# Patient Record
Sex: Male | Born: 1970 | Race: White | Hispanic: No | Marital: Married | Smoking: Current every day smoker
Health system: Southern US, Community
[De-identification: ages and names within clinical notes are randomized; demographics above are authoritative.]

## PROBLEM LIST (undated history)

## (undated) DIAGNOSIS — G4730 Sleep apnea, unspecified: Secondary | ICD-10-CM

## (undated) DIAGNOSIS — D469 Myelodysplastic syndrome, unspecified: Principal | ICD-10-CM

## (undated) DIAGNOSIS — E119 Type 2 diabetes mellitus without complications: Secondary | ICD-10-CM

## (undated) DIAGNOSIS — D473 Essential (hemorrhagic) thrombocythemia: Secondary | ICD-10-CM

## (undated) DIAGNOSIS — D471 Chronic myeloproliferative disease: Secondary | ICD-10-CM

## (undated) HISTORY — DX: Myelodysplastic syndrome, unspecified: D46.9

## (undated) HISTORY — DX: Chronic myeloproliferative disease: D47.1

## (undated) HISTORY — DX: Essential (hemorrhagic) thrombocythemia: D47.3

---

## 2004-09-29 HISTORY — PX: ROTATOR CUFF REPAIR: SHX139

## 2007-05-20 ENCOUNTER — Ambulatory Visit (HOSPITAL_BASED_OUTPATIENT_CLINIC_OR_DEPARTMENT_OTHER): Admission: RE | Admit: 2007-05-20 | Discharge: 2007-05-20 | Payer: Self-pay | Admitting: Orthopedic Surgery

## 2011-02-11 NOTE — Op Note (Signed)
NAME:  Patrick Blake, Patrick Blake              ACCOUNT NO.:  [id number]   MEDICAL RECORD NO.:  [id number]          PATIENT TYPE:  AMB   LOCATION:  DSC                          FACILITY:  [facility]   PHYSICIAN:  Loreta Ave, M.D. DATE OF BIRTH:  Dec 10, 1970   DATE OF PROCEDURE:  05/20/2007  DATE OF DISCHARGE:                               OPERATIVE REPORT   PREOPERATIVE DIAGNOSIS:  Left shoulder impingement, labral tear, distal  clavicle osteolysis.   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   PROCEDURE:  Left shoulder exam under anesthesia, arthroscopy, extensive  debridement glenohumeral joint including labrum and glenoid.  Removal of  loose fragments of labrum.  Bursectomy, acromioplasty, CA ligament  release.  Excision distal clavicle.   SURGEON:  Loreta Ave, M.D.   ASSISTANT:  Zonia Kief, PA   ANESTHESIA:  General.   BLOOD LOSS:  Minimal.   SPECIMENS:  None.   COMPLICATIONS:  None.   DRESSING:  Soft compressive with sling.   PROCEDURE:  The patient brought to the operating room, placed on  operating table in supine position.  After adequate anesthesia been  obtained, left shoulder examined.  Full motion good stability.  Placed  in beach-chair position on a shoulder positioner and prepped and draped  in the usual sterile fashion.  Three portals created, anterior,  posterior, lateral.  Shoulder entered from the poster portal with blunt  obturator.  Arthroscope introduced, shoulder distended and inspected.  Extensive complex circumferential tearing labrum.  Numerous displaced  fragments.  This was debrided all way around to stable margin removing  all loose fragments.  Some grade 2, mild grade 3 change found of the  glenoid debrided.  Biceps tendon, biceps anchor intact.  Undersurface of  the cuff looked good.  Cannula redirected subacromially.  A  lot of  reactive bursitis debrided.  Some erythema on the top of the cuff but  structurally intact.  Type 2 acromion.  After bursa resected, cuff  debrided.  Acromioplasty to a type 1 acromion releasing CA ligament with  cautery.  Distal clavicle grade 3 chondromalacia osteolysis.  Periarticular spurs lateral centimeter  resected.  Adequacy of decompression confirmed viewing from all portals.  Instruments and fluid removed.  Portals closed with nylon.  Sterile  compressive dressing applied.  Anesthesia reversed.  Brought to recovery  room.  Tolerated surgery well.  No complications.      Loreta Ave, M.D.  Electronically Signed     DFM/MEDQ  D:  05/20/2007  T:  05/21/2007  Job:  [id number]

## 2011-07-11 LAB — I-STAT 8, (EC8 V) (CONVERTED LAB)
BUN: 11
Chloride: 103
Potassium: 4.5
pCO2, Ven: 47.1
pH, Ven: 7.375 — ABNORMAL HIGH

## 2011-07-11 LAB — POCT HEMOGLOBIN-HEMACUE
Hemoglobin: 15.2
Operator id: 123881

## 2014-08-20 DIAGNOSIS — M25569 Pain in unspecified knee: Secondary | ICD-10-CM | POA: Insufficient documentation

## 2014-08-20 DIAGNOSIS — E10649 Type 1 diabetes mellitus with hypoglycemia without coma: Secondary | ICD-10-CM | POA: Insufficient documentation

## 2014-08-20 DIAGNOSIS — E119 Type 2 diabetes mellitus without complications: Secondary | ICD-10-CM | POA: Insufficient documentation

## 2014-12-25 ENCOUNTER — Other Ambulatory Visit: Payer: Self-pay | Admitting: Otolaryngology

## 2014-12-25 DIAGNOSIS — H905 Unspecified sensorineural hearing loss: Secondary | ICD-10-CM

## 2015-09-20 ENCOUNTER — Encounter: Payer: Self-pay | Admitting: Hematology & Oncology

## 2015-09-20 DIAGNOSIS — D75839 Thrombocytosis, unspecified: Secondary | ICD-10-CM

## 2015-09-20 DIAGNOSIS — D471 Chronic myeloproliferative disease: Secondary | ICD-10-CM

## 2015-09-20 DIAGNOSIS — D473 Essential (hemorrhagic) thrombocythemia: Secondary | ICD-10-CM | POA: Insufficient documentation

## 2015-09-20 HISTORY — DX: Thrombocytosis, unspecified: D75.839

## 2015-09-20 HISTORY — DX: Chronic myeloproliferative disease: D47.1

## 2015-09-21 ENCOUNTER — Ambulatory Visit (HOSPITAL_BASED_OUTPATIENT_CLINIC_OR_DEPARTMENT_OTHER): Payer: Commercial Managed Care - HMO | Admitting: Hematology & Oncology

## 2015-09-21 ENCOUNTER — Encounter: Payer: Self-pay | Admitting: Hematology & Oncology

## 2015-09-21 ENCOUNTER — Ambulatory Visit: Payer: Commercial Managed Care - HMO

## 2015-09-21 ENCOUNTER — Other Ambulatory Visit (HOSPITAL_BASED_OUTPATIENT_CLINIC_OR_DEPARTMENT_OTHER): Payer: Commercial Managed Care - HMO

## 2015-09-21 VITALS — BP 107/73 | HR 107 | Temp 97.4°F | Resp 16 | Ht 73.0 in | Wt 268.0 lb

## 2015-09-21 DIAGNOSIS — D471 Chronic myeloproliferative disease: Secondary | ICD-10-CM

## 2015-09-21 DIAGNOSIS — D75839 Thrombocytosis, unspecified: Secondary | ICD-10-CM

## 2015-09-21 DIAGNOSIS — D473 Essential (hemorrhagic) thrombocythemia: Secondary | ICD-10-CM | POA: Diagnosis not present

## 2015-09-21 LAB — CBC WITH DIFFERENTIAL (CANCER CENTER ONLY)
BASO#: 0 10*3/uL (ref 0.0–0.2)
BASO%: 0.3 % (ref 0.0–2.0)
EOS ABS: 0.1 10*3/uL (ref 0.0–0.5)
EOS%: 0.7 % (ref 0.0–7.0)
HCT: 39.9 % (ref 38.7–49.9)
HEMOGLOBIN: 12.8 g/dL — AB (ref 13.0–17.1)
LYMPH#: 1.7 10*3/uL (ref 0.9–3.3)
LYMPH%: 16.7 % (ref 14.0–48.0)
MCH: 26.4 pg — AB (ref 28.0–33.4)
MCHC: 32.1 g/dL (ref 32.0–35.9)
MCV: 82 fL (ref 82–98)
MONO#: 1.5 10*3/uL — ABNORMAL HIGH (ref 0.1–0.9)
MONO%: 14.9 % — AB (ref 0.0–13.0)
NEUT%: 67.4 % (ref 40.0–80.0)
NEUTROS ABS: 6.8 10*3/uL — AB (ref 1.5–6.5)
Platelets: 2053 10*3/uL — ABNORMAL HIGH (ref 145–400)
RBC: 4.85 10*6/uL (ref 4.20–5.70)
RDW: 22.7 % — ABNORMAL HIGH (ref 11.1–15.7)
WBC: 10 10*3/uL (ref 4.0–10.0)

## 2015-09-21 LAB — COMPREHENSIVE METABOLIC PANEL
ALBUMIN: 3.9 g/dL (ref 3.5–5.0)
ALK PHOS: 52 U/L (ref 40–150)
ALT: 13 U/L (ref 0–55)
AST: 14 U/L (ref 5–34)
Anion Gap: 10 mEq/L (ref 3–11)
BILIRUBIN TOTAL: 0.66 mg/dL (ref 0.20–1.20)
BUN: 13.8 mg/dL (ref 7.0–26.0)
CO2: 24 mEq/L (ref 22–29)
CREATININE: 0.9 mg/dL (ref 0.7–1.3)
Calcium: 9.2 mg/dL (ref 8.4–10.4)
Chloride: 102 mEq/L (ref 98–109)
GLUCOSE: 302 mg/dL — AB (ref 70–140)
Potassium: 4 mEq/L (ref 3.5–5.1)
SODIUM: 136 meq/L (ref 136–145)
TOTAL PROTEIN: 7 g/dL (ref 6.4–8.3)

## 2015-09-21 LAB — IRON AND TIBC
%SAT: 17 % — AB (ref 20–55)
Iron: 65 ug/dL (ref 42–163)
TIBC: 373 ug/dL (ref 202–409)
UIBC: 308 ug/dL (ref 117–376)

## 2015-09-21 LAB — FERRITIN: Ferritin: 13 ng/ml — ABNORMAL LOW (ref 22–316)

## 2015-09-21 LAB — LACTATE DEHYDROGENASE: LDH: 220 U/L (ref 125–245)

## 2015-09-21 LAB — TECHNOLOGIST REVIEW CHCC SATELLITE

## 2015-09-21 LAB — CHCC SATELLITE - SMEAR

## 2015-09-21 MED ORDER — ONDANSETRON HCL 8 MG PO TABS: 8.0000 mg | ORAL_TABLET | Freq: Three times a day (TID) | ORAL | Status: DC | PRN

## 2015-09-21 MED ORDER — HYDROXYUREA 500 MG PO CAPS: ORAL_CAPSULE | ORAL | Status: DC

## 2015-09-21 NOTE — Progress Notes (Signed)
Referral MD  Reason for Referral:  Marked thrombocytosis  Chief Complaint  Patient presents with  . OTHER    New Patient  :  My blood is not right.  HPI:  Patrick Blake  Is a very nice 44 year old white male. He has insulin-dependent diabetes. He's had this for over 20 years. He is on insulin for this but not on insulin pump.  He has some routine blood work done a couple days ago. This, shockingly, showed that he had marked thrombocytosis. His platelet count was over 2.4 million. He was slightly leukocutotic with a white cell count of 14 has hemoglobin was 12.8.   He did have some bleeding issues. He uses this implantable device that helps monitor his blood sugars. When  He would implant this, he was started to bleed. Whenever he cut himself, he would bleed profusely. He is taking baby aspirin. I told him to stop this.   There is no history of blood problems in the family. He does not smoke. He has occasional alcoholl use. 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   he has 2 kids. They're healthy. His wife came with him today.   He's had no travel. He's had no change in medications.   He's had no change in bowel or bladder habits. He's had no nausea or vomiting. Does not have any weight loss or weight gain. He's had no abdominal pain or early satiety.   His been no leg swelling. He's had no rashes. He's had no pain in the hands or feet. He's had no headache.   He developed a right subconjunctival hemorrhage a few days ago. This is not affecting his  Eyesight.   Overall, his performance status is ECOG 0.     Past Medical History  Diagnosis Date  . 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 Thrombocytosis ([place]) 09/20/2015  :  No past surgical history on file.:   Current outpatient prescriptions:  .  aspirin 81 MG chewable tablet, Chew 81 mg by mouth., Disp: , Rfl:  .  ezetimibe (ZETIA) 10 MG tablet, Take 10 mg by mouth., Disp: , Rfl:   .  indapamide (LOZOL) 1.25 MG tablet, Take 1.25 mg by mouth., Disp: , Rfl:  .  insulin aspart (NOVOLOG) 100 UNIT/ML injection, Inject into the skin., Disp: , Rfl:  .  ONE TOUCH ULTRA TEST test strip, USE AS DIRECTED. TESTING FREQUENCY: 2-4 TIMES DAILY., Disp: , Rfl: 11 .  pioglitazone (ACTOS) 30 MG tablet, Take 30 mg by mouth., Disp: , Rfl:  .  ramipril (ALTACE) 10 MG capsule, Take 10 mg by mouth., Disp: , Rfl:  .  TOUJEO SOLOSTAR 300 UNIT/ML SOPN, TAKE 40 UNITS BEFORE BREAKFAST DAILY, Disp: , Rfl: 12 .  hydroxyurea (HYDREA) 500 MG capsule, Take 2 pills TWICE a day. May take with food to minimize GI side effects., Disp: 120 capsule, Rfl: 3 .  ondansetron (ZOFRAN) 8 MG tablet, Take 1 tablet (8 mg total) by mouth every 8 (eight) hours as needed for nausea or vomiting., Disp: 30 tablet, Rfl: 2:  :  Not on File:  No family history on file.:  Social History   Social History  . Marital Status: Married    Spouse Name: N/A  . Number of Children: N/A  . Years of Education: N/A   Occupational History  . Not on file.   Social History Main Topics  . Smoking status: Never Smoker   . Smokeless  tobacco: Not on file  . Alcohol Use: Not on file  . Drug Use: Not on file  . Sexual Activity: Not on file   Other Topics Concern  . Not on file   Social History Narrative  :  Pertinent items are noted in HPI.  Exam: '@IPVITALS'$ @  obese white male in no obvious distress. Vital signs show temperature of 97.4. Pulse 107. Blood pressure 107/73. Weight is 268  Pounds. Head and neck exam shows the right subconjunctival hemorrhage. He has good pupillary movement. He has good extraocular movement. There is no oral lesions. He has no adenopathy in the neck. Thyroid is not palpable. Lungs are clear bilaterally. Cardiac exam regular rate and rhythm with no murmurs, rubs or bruits. Abdomen is soft. He has good bowel sounds. He is obese. He has no palpable fluid wave. There is no palpable abdominal mass. He has  no palpable liver or spleen tip. Back exam shows no tenderness over the spine, ribs or hips. Extremities shows no clubbing, cyanosis or edema. He does have some varicose veins in his legs. He has good range motion of his joints. Skin exam shows no erythema in his hands or feet. He has no ecchymoses or petechia.   Recent Labs  09/21/15 0841  WBC 10.0  HGB 12.8*  HCT 39.9  PLT 2,053*   No results for input(s): NA, K, CL, CO2, GLUCOSE, BUN, CREATININE, CALCIUM in the last 72 hours.  Blood smear review:   Mild anisocytosis and poikilocytosis. He has a rare nucleated red blood cell. He has no target cells. There are no schistocytes or spherocytes. He has no rouleau formation. White cells are slightly increased in number. He has some immature myeloid cells. He may have 1 blast. Platelets are markedly increased in number. He has numerous large platelets. He has an occasional mega-platelet.  Pathology:  none    Assessment and Plan:   Patrick Blake is a 44 year old white male. He has marked thrombocytosis. I have to believe that he will have some subtype of myeloproliferative neoplasm. I would have to think that essential thrombocythemia would be most likely by the blood smear. However, he does have some immature myeloid cells. We will check for chronic myeloid leukemia. I do not think that this is polycythemia. It is possible but with a low possibility that he may have myelofibrosis. I cannot feel his spleen but he is a large man.   We sent off the typical genetic assays for myeloproliferative neoplasms. I'm sure one of these will be positive. I would think that the JAK2 will be positive.   He does need to have a bone marrow biopsy. I think this is needed to try to help further qualify what is going on.   He will be started on Hydrea. We have to get his platelet count down. With the bleeding, he may have acquired von Willebrand disease. Again,I told him to stop the aspirin.   I spent about 1 hour  with he and his wife. I wrote down what I thought he had. I gave him a prescription for the Hydrea. He will take 1000 mg twice a day. I also gave him some Zofran in case he has any nausea.   I would like to see him back in about 3 weeks. By then, we will have had back all of his results and hopefully the bone marrow biopsy result.

## 2015-09-29 LAB — VON WILLEBRAND PANEL
Coagulation Factor VIII: 46 % — ABNORMAL LOW (ref 50–180)
RISTOCETIN CO-FACTOR, PLASMA: 38 % — AB (ref 42–200)
Von Willebrand Antigen, Plasma: 67 % (ref 50–217)

## 2015-09-29 LAB — CALRETICULIN (CALR) MUTATION ANALYSIS: CALR MUTATION (EXON 9): NOT DETECTED

## 2015-09-29 LAB — VITAMIN B12: VITAMIN B 12: 531 pg/mL (ref 211–911)

## 2015-09-29 LAB — APTT: APTT: 39 s — AB (ref 24–37)

## 2015-09-29 LAB — ERYTHROPOIETIN: Erythropoietin: 26.3 m[IU]/mL — ABNORMAL HIGH (ref 2.6–18.5)

## 2015-10-02 ENCOUNTER — Ambulatory Visit (HOSPITAL_COMMUNITY)
Admission: RE | Admit: 2015-10-02 | Discharge: 2015-10-02 | Disposition: A | Discharge status: Home / Self Care | Payer: Commercial Managed Care - HMO | Source: Ambulatory Visit | Attending: Hematology & Oncology | Admitting: Hematology & Oncology

## 2015-10-02 ENCOUNTER — Other Ambulatory Visit: Payer: Self-pay | Admitting: Radiology

## 2015-10-02 DIAGNOSIS — Z87891 Personal history of nicotine dependence: Secondary | ICD-10-CM | POA: Diagnosis not present

## 2015-10-02 DIAGNOSIS — D471 Chronic myeloproliferative disease: Secondary | ICD-10-CM

## 2015-10-02 DIAGNOSIS — D473 Essential (hemorrhagic) thrombocythemia: Secondary | ICD-10-CM

## 2015-10-02 DIAGNOSIS — D75839 Thrombocytosis, unspecified: Secondary | ICD-10-CM

## 2015-10-02 DIAGNOSIS — E109 Type 1 diabetes mellitus without complications: Secondary | ICD-10-CM | POA: Diagnosis not present

## 2015-10-02 DIAGNOSIS — R161 Splenomegaly, not elsewhere classified: Secondary | ICD-10-CM | POA: Insufficient documentation

## 2015-10-03 ENCOUNTER — Ambulatory Visit (HOSPITAL_COMMUNITY)
Admission: RE | Admit: 2015-10-03 | Discharge: 2015-10-03 | Disposition: A | Discharge status: Home / Self Care | Payer: Commercial Managed Care - HMO | Source: Ambulatory Visit | Attending: Hematology & Oncology | Admitting: Hematology & Oncology

## 2015-10-03 ENCOUNTER — Encounter (HOSPITAL_COMMUNITY): Payer: Self-pay

## 2015-10-03 DIAGNOSIS — D75839 Thrombocytosis, unspecified: Secondary | ICD-10-CM

## 2015-10-03 DIAGNOSIS — D473 Essential (hemorrhagic) thrombocythemia: Secondary | ICD-10-CM

## 2015-10-03 DIAGNOSIS — D471 Chronic myeloproliferative disease: Secondary | ICD-10-CM

## 2015-10-03 HISTORY — DX: Type 2 diabetes mellitus without complications: E11.9

## 2015-10-03 HISTORY — DX: Sleep apnea, unspecified: G47.30

## 2015-10-03 LAB — CBC WITH DIFFERENTIAL/PLATELET
BASOS PCT: 0 %
Band Neutrophils: 0 %
Basophils Absolute: 0 10*3/uL (ref 0.0–0.1)
Blasts: 0 %
EOS PCT: 3 %
Eosinophils Absolute: 0.3 10*3/uL (ref 0.0–0.7)
HEMATOCRIT: 35.8 % — AB (ref 39.0–52.0)
HEMOGLOBIN: 11.4 g/dL — AB (ref 13.0–17.0)
LYMPHS ABS: 1.8 10*3/uL (ref 0.7–4.0)
LYMPHS PCT: 18 %
MCH: 27.1 pg (ref 26.0–34.0)
MCHC: 31.8 g/dL (ref 30.0–36.0)
MCV: 85.2 fL (ref 78.0–100.0)
MONO ABS: 2 10*3/uL — AB (ref 0.1–1.0)
MONOS PCT: 20 %
Metamyelocytes Relative: 0 %
Myelocytes: 0 %
NEUTROS ABS: 5.3 10*3/uL (ref 1.7–7.7)
NEUTROS PCT: 54 %
NRBC: 0 /100{WBCs}
OTHER: 5 %
Platelets: 2039 10*3/uL (ref 150–400)
Promyelocytes Absolute: 0 %
RBC: 4.2 MIL/uL — AB (ref 4.22–5.81)
RDW: 23.2 % — AB (ref 11.5–15.5)
WBC: 9.9 10*3/uL (ref 4.0–10.5)

## 2015-10-03 LAB — GLUCOSE, CAPILLARY
GLUCOSE-CAPILLARY: 227 mg/dL — AB (ref 65–99)
Glucose-Capillary: 284 mg/dL — ABNORMAL HIGH (ref 65–99)
Glucose-Capillary: 244 mg/dL — ABNORMAL HIGH (ref 65–99)
Glucose-Capillary: 311 mg/dL — ABNORMAL HIGH (ref 65–99)

## 2015-10-03 LAB — PROTIME-INR
INR: 1.1 (ref 0.00–1.49)
Prothrombin Time: 14.4 seconds (ref 11.6–15.2)

## 2015-10-03 LAB — BONE MARROW EXAM

## 2015-10-03 MED ORDER — INSULIN GLARGINE 100 UNIT/ML ~~LOC~~ SOLN
25.0000 [IU] | SUBCUTANEOUS | Status: DC
  Filled 2015-10-03: qty 0.25

## 2015-10-03 MED ORDER — INSULIN GLARGINE 300 UNIT/ML ~~LOC~~ SOPN
30.0000 [IU] | PEN_INJECTOR | Freq: Once | SUBCUTANEOUS | Status: AC
  Administered 2015-10-03: 30 [IU] via SUBCUTANEOUS

## 2015-10-03 MED ORDER — SODIUM CHLORIDE 0.9 % IV SOLN
INTRAVENOUS | Status: DC
  Administered 2015-10-03: 07:00:00 via INTRAVENOUS

## 2015-10-03 MED ORDER — MIDAZOLAM HCL 2 MG/2ML IJ SOLN
INTRAMUSCULAR | Status: AC | PRN
  Administered 2015-10-03: 0.5 mg via INTRAVENOUS
  Administered 2015-10-03 (×2): 1 mg via INTRAVENOUS

## 2015-10-03 MED ORDER — FENTANYL CITRATE (PF) 100 MCG/2ML IJ SOLN
INTRAMUSCULAR | Status: AC
  Filled 2015-10-03: qty 4

## 2015-10-03 MED ORDER — MIDAZOLAM HCL 2 MG/2ML IJ SOLN
INTRAMUSCULAR | Status: AC
  Filled 2015-10-03: qty 6

## 2015-10-03 MED ORDER — FENTANYL CITRATE (PF) 100 MCG/2ML IJ SOLN
INTRAMUSCULAR | Status: AC | PRN
  Administered 2015-10-03 (×2): 50 ug via INTRAVENOUS

## 2015-10-03 NOTE — Progress Notes (Signed)
CBG post procedure 311. Pt self dosing short acting novolog insulin 12 u SQ. Rowe Robert aware.

## 2015-10-03 NOTE — Progress Notes (Signed)
CRITICAL VALUE ALERT  Critical value received: platelets 2039 Date of notification:  0800  Time of notification:  0800  Critical value read back:Yes.    Nurse who received alert:  Jonna Clark RN   MD notified (1st page):  Rowe Robert PA  Time of first page:  0810  MD notified (2nd page):  Time of second page:  Responding MD:  Rowe Robert PA   Time MD responded:  [phone]

## 2015-10-03 NOTE — Discharge Instructions (Signed)
Bone Marrow Aspiration and Bone Marrow Biopsy, Care After Do not return to work tomorrow 10/04/2015. Refer to this sheet in the next few weeks. These instructions provide you with information about caring for yourself after your procedure. Your health care provider may also give you more specific instructions. Your treatment has been planned according to current medical practices, but problems sometimes occur. Call your health care provider if you have any problems or questions after your procedure. WHAT TO EXPECT AFTER THE PROCEDURE After your procedure, it is common to have:  Soreness or tenderness around the puncture site.  Bruising. HOME CARE INSTRUCTIONS  Take medicines only as directed by your health care provider.  Follow your health care provider's instructions about:  Puncture site care.  Bandage (dressing) changes and removal.  Bathe and shower as directed by your health care provider.  Check your puncture site every day for signs of infection. Watch for:  Redness, swelling, or pain.  Fluid, blood, or pus.  Return to your normal activities as directed by your health care provider.  Keep all follow-up visits as directed by your health care provider. This is important. SEEK MEDICAL CARE IF:  You have a fever.  You have uncontrollable bleeding.  You have redness, swelling, or pain at the site of your puncture.  You have fluid, blood, or pus coming from your puncture site.   This information is not intended to replace advice given to you by your health care provider. Make sure you discuss any questions you have with your health care provider.   Document Released: 04/04/2005 Document Revised: 01/30/2015 Document Reviewed: 09/06/2014 Elsevier Interactive Patient Education 2016 [place]. Bone Marrow Aspiration and Bone Marrow Biopsy Bone marrow aspiration and bone marrow biopsy are procedures that are done to diagnose blood disorders. You may also have one of these  procedures to help diagnose infections or some types of cancer. Bone marrow is the soft tissue that is inside your bones. Blood cells are produced in bone marrow. For bone marrow aspiration, a sample of tissue in liquid form is removed from inside your bone. For a bone marrow biopsy, a small core of bone marrow tissue is removed. Then these samples are examined under a microscope or tested in a lab. You may need these procedures if you have an abnormal complete blood count (CBC). The aspiration or biopsy sample is usually taken from the top of your hip bone. Sometimes, an aspiration sample is taken from your chest bone (sternum). LET [facility] CARE PROVIDER KNOW ABOUT:  Any allergies you have.  All medicines you are taking, including vitamins, herbs, eye drops, creams, and over-the-counter medicines.  Previous problems you or members of your family have had with the use of anesthetics.  Any blood disorders you have.  Previous surgeries you have had.  Any medical conditions you may have.  Whether you are pregnant or you think that you may be pregnant. RISKS AND COMPLICATIONS Generally, this is a safe procedure. However, problems may occur, including:  Infection.  Bleeding. BEFORE THE PROCEDURE  Ask your health care provider about:  Changing or stopping your regular medicines. This is especially important if you are taking diabetes medicines or blood thinners.  Taking medicines such as aspirin and ibuprofen. These medicines can thin your blood. Do not take these medicines before your procedure if your health care provider instructs you not to.  Plan to have someone take you home after the procedure.  If you go home right after the  procedure, plan to have someone with you for 24 hours. PROCEDURE   An IV tube may be inserted into one of your veins.  The injection site will be cleaned with a germ-killing solution (antiseptic).  You will be given one or more of the  following:  A medicine that helps you relax (sedative).  A medicine that numbs the area (local anesthetic).  The bone marrow sample will be removed as follows:  For an aspiration, a hollow needle will be inserted through your skin and into your bone. Bone marrow fluid will be drawn up into a syringe.  For a biopsy, your health care provider will use a hollow needle to remove a core of tissue from your bone marrow.  The needle will be removed.  A bandage (dressing) will be placed over the insertion site and taped in place. The procedure may vary among health care providers and hospitals. AFTER THE PROCEDURE  Your blood pressure, heart rate, breathing rate, and blood oxygen level will be monitored often until the medicines you were given have worn off.  Return to your normal activities as directed by your health care provider.   This information is not intended to replace advice given to you by your health care provider. Make sure you discuss any questions you have with your health care provider.   Document Released: 09/18/2004 Document Revised: 01/30/2015 Document Reviewed: 09/06/2014 Elsevier Interactive Patient Education 2016 [employer] Moderate Conscious Sedation, Adult Sedation is the use of medicines to promote relaxation and relieve discomfort and anxiety. Moderate conscious sedation is a type of sedation. Under moderate conscious sedation you are less alert than normal but are still able to respond to instructions or stimulation. Moderate conscious sedation is used during short medical and dental procedures. It is milder than deep sedation or general anesthesia and allows you to return to your regular activities sooner. LET [facility] CARE PROVIDER KNOW ABOUT:   Any allergies you have.  All medicines you are taking, including vitamins, herbs, eye drops, creams, and over-the-counter medicines.  Use of steroids (by mouth or creams).  Previous problems you or members of  your family have had with the use of anesthetics.  Any blood disorders you have.  Previous surgeries you have had.  Medical conditions you have.  Possibility of pregnancy, if this applies.  Use of cigarettes, alcohol, or illegal drugs. RISKS AND COMPLICATIONS Generally, this is a safe procedure. However, as with any procedure, problems can occur. Possible problems include:  Oversedation. Moderate Conscious Sedation, Adult, Care After Refer to this sheet in the next few weeks. These instructions provide you with information on caring for yourself after your procedure. Your health care provider may also give you more specific instructions. Your treatment has been planned according to current medical practices, but problems sometimes occur. Call your health care provider if you have any problems or questions after your procedure. WHAT TO EXPECT AFTER THE PROCEDURE  After your procedure:  You may feel sleepy, clumsy, and have poor balance for several hours.  Vomiting may occur if you eat too soon after the procedure. HOME CARE INSTRUCTIONS  Do not participate in any activities where you could become injured for at least 24 hours. Do not:  Drive.  Swim.  Ride a bicycle.  Operate heavy machinery.  Cook.  Use power tools.  Climb ladders.  Work from a high place.  Do not make important decisions or sign legal documents until you are improved.  If you vomit, drink  water, juice, or soup when you can drink without vomiting. Make sure you have little or no nausea before eating solid foods.  Only take over-the-counter or prescription medicines for pain, discomfort, or fever as directed by your health care provider.  Make sure you and your family fully understand everything about the medicines given to you, including what side effects may occur.  You should not drink alcohol, take sleeping pills, or take medicines that cause drowsiness for at least 24 hours.  If you smoke, do  not smoke without supervision.  If you are feeling better, you may resume normal activities 24 hours after you were sedated.  Keep all appointments with your health care provider. SEEK MEDICAL CARE IF:  Your skin is pale or bluish in color.  You continue to feel nauseous or vomit.  Your pain is getting worse and is not helped by medicine.  You have bleeding or swelling.  You are still sleepy or feeling clumsy after 24 hours. SEEK IMMEDIATE MEDICAL CARE IF:  You develop a rash.  You have difficulty breathing.  You develop any type of allergic problem.  You have a fever. MAKE SURE YOU:  Understand these instructions.  Will watch your condition.  Will get help right away if you are not doing well or get worse.   This information is not intended to replace advice given to you by your health care provider. Make sure you discuss any questions you have with your health care provider.   Document Released: 07/06/2013 Document Revised: 10/06/2014 Document Reviewed: 07/06/2013 Elsevier Interactive Patient Education 2016 [place] breathing on your own. You may need to have a breathing tube until you are awake and breathing on your own.  Allergic reaction to any of the medicines used for the procedure. BEFORE THE PROCEDURE  You may have blood tests done. These tests can help show how well your kidneys and liver are working. They can also show how well your blood clots.  A physical exam will be done.  Only take medicines as directed by your health care provider. You may need to stop taking medicines (such as blood thinners, aspirin, or nonsteroidal anti-inflammatory drugs) before the procedure.   Do not eat or drink at least 6 hours before the procedure or as directed by your health care provider.  Arrange for a responsible adult, family member, or friend to take you home after the procedure. He or she should stay with you for at least 24 hours after the  procedure, until the medicine has worn off. PROCEDURE   An intravenous (IV) catheter will be inserted into one of your veins. Medicine will be able to flow directly into your body through this catheter. You may be given medicine through this tube to help prevent pain and help you relax.  The medical or dental procedure will be done. AFTER THE PROCEDURE  You will stay in a recovery area until the medicine has worn off. Your blood pressure and pulse will be checked.   Depending on the procedure you had, you may be allowed to go home when you can tolerate liquids and your pain is under control.   This information is not intended to replace advice given to you by your health care provider. Make sure you discuss any questions you have with your health care provider.   Document Released: 06/10/2001 Document Revised: 10/06/2014 Document Reviewed: 05/23/2013 Elsevier Interactive Patient Education [employer].

## 2015-10-03 NOTE — H&P (Signed)
Chief Complaint: Patient was seen in consultation today for CT guided bone marrow biopsy  Referring Physician(s): Ennever,Peter R  History of Present Illness: Patrick Blake is a 45 y.o. male with history of marked thrombocytosis who presents today for CT guided bone marrow biopsy for further evaluation.   Past Medical History  Diagnosis Date  . MPN (myeloproliferative neoplasm) ([place]) 09/20/2015  . Thrombocytosis ([place]) 09/20/2015  . Diabetes mellitus without complication ([place])   . Sleep apnea     loud snore    Past Surgical History  Procedure Laterality Date  . Rotator cuff repair Left 2006    Allergies: Review of patient's allergies indicates not on file.  Medications: Prior to Admission medications   Medication Sig Start Date End Date Taking? Authorizing Provider  ezetimibe (ZETIA) 10 MG tablet Take 10 mg by mouth.   Yes Historical Provider, MD  indapamide (LOZOL) 1.25 MG tablet Take 1.25 mg by mouth.   Yes Historical Provider, MD  aspirin 81 MG chewable tablet Chew 81 mg by mouth.    Historical Provider, MD  hydroxyurea (HYDREA) 500 MG capsule Take 2 pills TWICE a day. May take with food to minimize GI side effects. 09/21/15   [name], MD  insulin aspart (NOVOLOG) 100 UNIT/ML injection Inject into the skin.    Historical Provider, MD  ondansetron (ZOFRAN) 8 MG tablet Take 1 tablet (8 mg total) by mouth every 8 (eight) hours as needed for nausea or vomiting. 09/21/15   [name], MD  ONE TOUCH ULTRA TEST test strip USE AS DIRECTED. TESTING FREQUENCY: 2-4 TIMES DAILY. 08/24/15   Historical Provider, MD  pioglitazone (ACTOS) 30 MG tablet Take 30 mg by mouth.    Historical Provider, MD  ramipril (ALTACE) 10 MG capsule Take 10 mg by mouth.    Historical Provider, MD  TOUJEO SOLOSTAR 300 UNIT/ML SOPN TAKE 40 UNITS BEFORE BREAKFAST DAILY 08/26/15   Historical Provider, MD     Family History  Problem Relation Age of Onset  . Coronary artery disease Brother      Social History   Social History  . Marital Status: Married    Spouse Name: N/A  . Number of Children: N/A  . Years of Education: N/A   Social History Main Topics  . Smoking status: Former Smoker    Quit date: 09/29/2008  . Smokeless tobacco: None  . Alcohol Use: 0.0 oz/week    0 Standard drinks or equivalent per week     Comment: 1 to 2 times per week  . Drug Use: None  . Sexual Activity: Not Asked   Other Topics Concern  . None   Social History Narrative      Review of Systems  Constitutional: Negative for fever, chills and fatigue.  Respiratory: Negative for cough and shortness of breath.   Cardiovascular: Negative for chest pain.  Gastrointestinal: Negative for nausea, vomiting, abdominal pain and blood in stool.  Genitourinary: Negative for dysuria and hematuria.  Musculoskeletal: Negative for back pain.  Neurological: Negative for headaches.    Vital Signs: BP 107/73  HR 107  R 16  TEMP 97.4   Physical Exam  Constitutional: He is oriented to person, place, and time. He appears well-developed and well-nourished.  Cardiovascular: Regular rhythm.   Sl tachy  Pulmonary/Chest: Effort normal and breath sounds normal.  Abdominal: Soft. Bowel sounds are normal. There is no tenderness.  obese  Musculoskeletal: Normal range of motion.  Neurological: He is alert and  oriented to person, place, and time.    Mallampati Score:     Imaging: US Abdomen Complete  10/02/2015  CLINICAL DATA:  Thrombocytosis. EXAM: ABDOMEN ULTRASOUND COMPLETE COMPARISON:  No prior. FINDINGS: Gallbladder: No gallstones or wall thickening visualized. No sonographic Murphy sign noted by sonographer. Common bile duct: Diameter: 3.5 mm Liver: Liver is heterogeneous suggesting fatty infiltration and/or hepatocellular disease. A 1.4 x 1.4 x 1.3 cm echogenic focus noted in the central portion liver, most likely tiny hemangioma. IVC: No abnormality visualized. Pancreas: Visualized portion  unremarkable. Spleen: Splenomegaly to 13.9 cm with a volume of 491 cc. Right Kidney: Length: 13.5 cm. Echogenicity within normal limits. No mass or hydronephrosis visualized. Left Kidney: Length: 13.2 cm. Echogenicity within normal limits. No mass or hydronephrosis visualized. Abdominal aorta: No aneurysm visualized. Other findings: None. IMPRESSION: 1. Heterogeneous liver suggesting fatty infiltration and/or hepatocellular disease. 1.4 cm well-circumscribed echogenic focus in the central portion liver, most likely tiny hemangioma. 2. Splenomegaly. Electronically Signed   By: Marcello Moores  Register   On: 10/02/2015 10:07    Labs:  CBC:  Recent Labs  09/21/15 0841 10/03/15 0730  WBC 10.0 9.9  HGB 12.8* 11.4*  HCT 39.9 35.8*  PLT 2,053* 2039*    COAGS:  Recent Labs  09/21/15 0841 10/03/15 0730  INR  --  1.10  APTT 39*  --     BMP:  Recent Labs  09/21/15 0841  NA 136  K 4.0  CO2 24  GLUCOSE 302*  BUN 13.8  CALCIUM 9.2  CREATININE 0.9    LIVER FUNCTION TESTS:  Recent Labs  09/21/15 0841  BILITOT 0.66  AST 14  ALT 13  ALKPHOS 52  PROT 7.0  ALBUMIN 3.9    TUMOR MARKERS: No results for input(s): AFPTM, CEA, CA199, CHROMGRNA in the last 8760 hours.  Assessment and Plan:  45 y.o. male with history of marked thrombocytosis who presents today for CT guided bone marrow biopsy for further evaluation. Risks and benefits discussed with the patient/wife including, but not limited to bleeding, infection, damage to adjacent structures or low yield requiring additional tests.All of the patient's questions were answered, patient is agreeable to proceed.Consent signed and in chart.      Thank you for this interesting consult.  I greatly enjoyed meeting Patrick Blake and look forward to participating in their care.  A copy of this report was sent to the requesting provider on this date.  Signed: D. Lennette Bihari Becker Christopher 10/03/2015, 8:30 AM   I spent a total of 15 minutes in face to  face in clinical consultation, greater than 50% of which was counseling/coordinating care for CT guided bone marrow biopsy

## 2015-10-03 NOTE — Procedures (Signed)
Technically successful CT guided bone marrow aspiration and biopsy of left iliac crest. No immediate complications.    SignedSandi Mariscal [id number] 10/03/2015, 9:51 AM

## 2015-10-03 NOTE — Progress Notes (Signed)
Diabetes Nurse, Tama Headings and Rowe Robert, PA consulted regarding Pt's Type I diabetic insulin needs. Pt self administered long acting insulin. See orders and CBG results.

## 2015-10-05 ENCOUNTER — Encounter: Payer: Self-pay | Admitting: Hematology & Oncology

## 2015-10-08 ENCOUNTER — Encounter: Payer: Self-pay | Admitting: Hematology & Oncology

## 2015-10-08 ENCOUNTER — Other Ambulatory Visit (HOSPITAL_BASED_OUTPATIENT_CLINIC_OR_DEPARTMENT_OTHER): Payer: Commercial Managed Care - HMO

## 2015-10-08 ENCOUNTER — Telehealth: Payer: Self-pay | Admitting: Hematology & Oncology

## 2015-10-08 ENCOUNTER — Ambulatory Visit (HOSPITAL_BASED_OUTPATIENT_CLINIC_OR_DEPARTMENT_OTHER)
Admission: RE | Admit: 2015-10-08 | Discharge: 2015-10-08 | Disposition: A | Discharge status: Home / Self Care | Payer: Commercial Managed Care - HMO | Source: Ambulatory Visit | Attending: Hematology & Oncology | Admitting: Hematology & Oncology

## 2015-10-08 ENCOUNTER — Ambulatory Visit (HOSPITAL_BASED_OUTPATIENT_CLINIC_OR_DEPARTMENT_OTHER): Payer: Commercial Managed Care - HMO | Admitting: Hematology & Oncology

## 2015-10-08 VITALS — BP 131/81 | HR 96 | Temp 98.2°F | Resp 16 | Ht 73.0 in | Wt 275.0 lb

## 2015-10-08 DIAGNOSIS — R519 Headache, unspecified: Secondary | ICD-10-CM

## 2015-10-08 DIAGNOSIS — D75839 Thrombocytosis, unspecified: Secondary | ICD-10-CM

## 2015-10-08 DIAGNOSIS — D473 Essential (hemorrhagic) thrombocythemia: Secondary | ICD-10-CM

## 2015-10-08 DIAGNOSIS — D471 Chronic myeloproliferative disease: Secondary | ICD-10-CM

## 2015-10-08 DIAGNOSIS — R51 Headache: Secondary | ICD-10-CM | POA: Diagnosis present

## 2015-10-08 DIAGNOSIS — G3189 Other specified degenerative diseases of nervous system: Secondary | ICD-10-CM | POA: Diagnosis not present

## 2015-10-08 LAB — CBC WITH DIFFERENTIAL (CANCER CENTER ONLY)
BASO#: 0 10*3/uL (ref 0.0–0.2)
BASO%: 0.3 % (ref 0.0–2.0)
EOS ABS: 0 10*3/uL (ref 0.0–0.5)
EOS%: 0.1 % (ref 0.0–7.0)
HCT: 35.7 % — ABNORMAL LOW (ref 38.7–49.9)
HEMOGLOBIN: 11.6 g/dL — AB (ref 13.0–17.1)
LYMPH#: 1.3 10*3/uL (ref 0.9–3.3)
LYMPH%: 18.7 % (ref 14.0–48.0)
MCH: 27.3 pg — AB (ref 28.0–33.4)
MCHC: 32.5 g/dL (ref 32.0–35.9)
MCV: 84 fL (ref 82–98)
MONO#: 1.5 10*3/uL — ABNORMAL HIGH (ref 0.1–0.9)
MONO%: 21.6 % — AB (ref 0.0–13.0)
NEUT#: 4 10*3/uL (ref 1.5–6.5)
NEUT%: 59.3 % (ref 40.0–80.0)
RBC: 4.25 10*6/uL (ref 4.20–5.70)
RDW: 25.1 % — ABNORMAL HIGH (ref 11.1–15.7)
WBC: 6.7 10*3/uL (ref 4.0–10.0)

## 2015-10-08 LAB — TECHNOLOGIST REVIEW CHCC SATELLITE

## 2015-10-08 LAB — CMP (CANCER CENTER ONLY)
ALT(SGPT): 19 U/L (ref 10–47)
AST: 21 U/L (ref 11–38)
Albumin: 3.6 g/dL (ref 3.3–5.5)
Alkaline Phosphatase: 42 U/L (ref 26–84)
BUN: 12 mg/dL (ref 7–22)
CALCIUM: 8.3 mg/dL (ref 8.0–10.3)
CHLORIDE: 102 meq/L (ref 98–108)
CO2: 28 mEq/L (ref 18–33)
Creat: 0.9 mg/dl (ref 0.6–1.2)
Glucose, Bld: 107 mg/dL (ref 73–118)
POTASSIUM: 4.3 meq/L (ref 3.3–4.7)
Sodium: 143 mEq/L (ref 128–145)
TOTAL PROTEIN: 6.7 g/dL (ref 6.4–8.1)
Total Bilirubin: 0.7 mg/dl (ref 0.20–1.60)

## 2015-10-08 LAB — CHCC SATELLITE - SMEAR

## 2015-10-08 NOTE — Telephone Encounter (Signed)
Submit Clinical Request   Patient ID: [id number]  Time: Time: 10/08/2015 3:59 PM   Patient Name: Patrick Blake, Patrick Blake   Patient DOB: Jun 26, 1971   Physician Name: Burney Gauze  Physician Address: [address] [place], [place] [address]  Physician Tax HR:[id number]   CPT Code: [phone]  This member's plan does not currently require notification or prior-authorization through the [place] Notification or Prior-Authorization Program. Please contact a Customer Care Professional at [phone] if you believe the information returned to be in error.  Please print and retain a copy of this confirmation in the patient's medical record.   CT HEAD

## 2015-10-08 NOTE — Progress Notes (Signed)
Hematology and Oncology Follow Up Visit  Patrick Blake [id number] 04-08-1971 45 y.o. 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 This is a second office visit. We first saw him back in December. At that point, his plate count was 2.4 million.  We did a fairly extensive workup on him. We sent off his of blood for genetic testing. He was negative for all standard genetic mutations:  BCR/ABL, JAK2, Calreticulin, MPL515.  Surprisingly enough, he is positive for acquired Veto Kemps disease. His factor VIII level is 46. His von Willebrand Ag is 67%. His PTT was 39 seconds.  He is on baby aspirin right now. I think this is okay.  We did do a bone marrow biopsy on him. This was done on January 4. The bone marrow report ([id number]) showed a hypercellular marrow with Pam myeloid proliferation. He had marked increase in megakaryocytes. So in megakaryocytes looked abnormal. He had 7% myeloblasts. There is no fibrosis. Storage iron was decreased.  I do not have back yet the chromosome studies.  We did do an abdominal ultrasound. He has mild splenomegaly.  I suspect that he definitely has essential thrombocythemia. He has tolerated a Hydrea okay. His blood sugars have been on the high side. However, his platelets have come down.  He is still working.  The right subconjunctival bleed has resolved.  He does not bleed when he places his subcutaneous glucose monitor.  He's had no fever. He's had no epistaxis. He's had no mouth sores or gingival bleeding.  Overall, his performance status is ECOG 1.  Medications:  Current outpatient prescriptions:  .  aspirin 81 MG chewable tablet, Chew 81 mg by mouth., Disp: , Rfl:  .  ezetimibe (ZETIA) 10 MG tablet, Take 10 mg by mouth., Disp: , Rfl:  .  hydroxyurea  (HYDREA) 500 MG capsule, Take 2 pills TWICE a day. May take with food to minimize GI side effects., Disp: 120 capsule, Rfl: 3 .  indapamide (LOZOL) 1.25 MG tablet, Take 1.25 mg by mouth., Disp: , Rfl:  .  insulin aspart (NOVOLOG) 100 UNIT/ML injection, Inject into the skin., Disp: , Rfl:  .  ondansetron (ZOFRAN) 8 MG tablet, Take 1 tablet (8 mg total) by mouth every 8 (eight) hours as needed for nausea or vomiting., Disp: 30 tablet, Rfl: 2 .  ONE TOUCH ULTRA TEST test strip, USE AS DIRECTED. TESTING FREQUENCY: 2-4 TIMES DAILY., Disp: , Rfl: 11 .  pioglitazone (ACTOS) 30 MG tablet, Take 30 mg by mouth., Disp: , Rfl:  .  ramipril (ALTACE) 10 MG capsule, Take 10 mg by mouth., Disp: , Rfl:  .  TOUJEO SOLOSTAR 300 UNIT/ML SOPN, TAKE 40 UNITS BEFORE BREAKFAST DAILY, Disp: , Rfl: 12  Allergies: Not on File  Past Medical History, Surgical history, Social history, and Family History were reviewed and updated.  Review of Systems: As above  Physical Exam:  height is '6\' 1"'$  (1.854 m) and weight is 275 lb (124.739 kg). His oral temperature is 98.2 F (36.8 C). His blood pressure is 131/81 and his pulse is 96. His respiration is 16.   Wt Readings from Last 3 Encounters:  10/08/15 275 lb (124.739 kg)  09/21/15 268 lb (121.564 kg)     Obese white male in no obvious distress. Head and neck exam  shows no ocular or oral lesions. He has no palpable cervical or supraclavicular lymph nodes. Lungs are clear. Cardiac exam regular rate and rhythm with no murmurs, rubs or bruits. Abdomen is soft. He has good bowel sounds. There is no fluid wave. There is no palpable liver or spleen tip. Back exam shows no tenderness over the spine, ribs or hips. Extremity shows no clubbing, cyanosis or edema. Skin exam shows no rashes, ecchymoses or petechia. Neurological exam shows no focal neurological deficits.  Lab Results  Component Value Date   WBC 6.7 10/08/2015   HGB 11.6* 10/08/2015   HCT 35.7* 10/08/2015   MCV 84  10/08/2015   PLT 1,541* 10/08/2015     Chemistry      Component Value Date/Time   NA 143 10/08/2015 1413   NA 136 09/21/2015 0841   NA 139 05/18/2007 1542   K 4.3 10/08/2015 1413   K 4.0 09/21/2015 0841   K 4.5 05/18/2007 1542   CL 102 10/08/2015 1413   CL 103 05/18/2007 1542   CO2 28 10/08/2015 1413   CO2 24 09/21/2015 0841   BUN 12 10/08/2015 1413   BUN 13.8 09/21/2015 0841   BUN 11 05/18/2007 1542   CREATININE 0.9 10/08/2015 1413   CREATININE 0.9 09/21/2015 0841      Component Value Date/Time   CALCIUM 8.3 10/08/2015 1413   CALCIUM 9.2 09/21/2015 0841   ALKPHOS 42 10/08/2015 1413   ALKPHOS 52 09/21/2015 0841   AST 21 10/08/2015 1413   AST 14 09/21/2015 0841   ALT 19 10/08/2015 1413   ALT 13 09/21/2015 0841   BILITOT 0.70 10/08/2015 1413   BILITOT 0.66 09/21/2015 0841         Impression and Plan: Patrick Blake is a 45 year old white male. He has essential thrombocythemia. I think this is most consistent diagnosis. He is responding to the Hydrea.  I do not have back the chromosome studies. This I think will be quite helpful. Hopefully, they will come back this week.  I will keep him on the [facility] for right now. His blood sugar when checked this afternoon was only 107. His glucose monitor is showing that his blood sugars are under good control.  I told he has wife that is good had to stay on Hydrea for the foreseeable future. I do not see that we have to make a change. I think if we have to make a change, we can certainly use anagrelide. I think we also could probably use JAKAFI even though he is JAK2 negative.  I do not see need for another bone marrow test.  For now, I think we need to follow his blood work every 2 weeks. I will plan to see him back in one month.  I spent about 45 minutes with he and his wife. I gave him copies of his bone marrow, labs and went over the results. I answered all their questions. I reassured him that I does not think that he was going  to have any problems given that his toilet count is improving.  I told him that I would call them once again at the chromosome studies back.   [name], MD 1/9/20174:20 PM

## 2015-10-09 ENCOUNTER — Telehealth: Payer: Self-pay | Admitting: *Deleted

## 2015-10-09 NOTE — Telephone Encounter (Addendum)
  Patient aware of results.   ----- Message from [name], MD sent at 10/08/2015  4:54 PM EST ----- Call - NO bleeding or sinus infection.  Everything looks ok!!!  Laurey Arrow

## 2015-10-10 ENCOUNTER — Telehealth: Payer: Self-pay | Admitting: Hematology & Oncology

## 2015-10-10 NOTE — Telephone Encounter (Signed)
Faxed completed FMLA papers to:   Wyatt Portela: [id number] P: [id number]      COPY SCANNED

## 2015-10-11 LAB — CHROMOSOME ANALYSIS, BONE MARROW

## 2015-10-11 LAB — TISSUE HYBRIDIZATION (BONE MARROW)-NCBH

## 2015-10-22 ENCOUNTER — Other Ambulatory Visit (HOSPITAL_BASED_OUTPATIENT_CLINIC_OR_DEPARTMENT_OTHER): Payer: Commercial Managed Care - HMO

## 2015-10-22 DIAGNOSIS — D473 Essential (hemorrhagic) thrombocythemia: Secondary | ICD-10-CM

## 2015-10-22 DIAGNOSIS — R51 Headache: Principal | ICD-10-CM

## 2015-10-22 DIAGNOSIS — R519 Headache, unspecified: Secondary | ICD-10-CM

## 2015-10-22 LAB — CMP (CANCER CENTER ONLY)
ALBUMIN: 3.7 g/dL (ref 3.3–5.5)
ALT(SGPT): 18 U/L (ref 10–47)
AST: 20 U/L (ref 11–38)
Alkaline Phosphatase: 43 U/L (ref 26–84)
BILIRUBIN TOTAL: 0.7 mg/dL (ref 0.20–1.60)
BUN: 14 mg/dL (ref 7–22)
CO2: 28 meq/L (ref 18–33)
CREATININE: 0.9 mg/dL (ref 0.6–1.2)
Calcium: 8.3 mg/dL (ref 8.0–10.3)
Chloride: 100 mEq/L (ref 98–108)
GLUCOSE: 115 mg/dL (ref 73–118)
Potassium: 4 mEq/L (ref 3.3–4.7)
SODIUM: 139 meq/L (ref 128–145)
Total Protein: 6.5 g/dL (ref 6.4–8.1)

## 2015-10-22 LAB — CBC WITH DIFFERENTIAL (CANCER CENTER ONLY)
BASO#: 0 10*3/uL (ref 0.0–0.2)
BASO%: 0.6 % (ref 0.0–2.0)
EOS%: 0.1 % (ref 0.0–7.0)
Eosinophils Absolute: 0 10*3/uL (ref 0.0–0.5)
HCT: 36.7 % — ABNORMAL LOW (ref 38.7–49.9)
HEMOGLOBIN: 12.2 g/dL — AB (ref 13.0–17.1)
LYMPH#: 1.4 10*3/uL (ref 0.9–3.3)
LYMPH%: 19.6 % (ref 14.0–48.0)
MCH: 28.6 pg (ref 28.0–33.4)
MCHC: 33.2 g/dL (ref 32.0–35.9)
MCV: 86 fL (ref 82–98)
MONO#: 1.6 10*3/uL — ABNORMAL HIGH (ref 0.1–0.9)
MONO%: 22.9 % — AB (ref 0.0–13.0)
NEUT%: 56.8 % (ref 40.0–80.0)
NEUTROS ABS: 4 10*3/uL (ref 1.5–6.5)
Platelets: 914 10*3/uL — ABNORMAL HIGH (ref 145–400)
RBC: 4.26 10*6/uL (ref 4.20–5.70)
RDW: 28 % — AB (ref 11.1–15.7)
WBC: 7 10*3/uL (ref 4.0–10.0)

## 2015-10-22 LAB — TECHNOLOGIST REVIEW CHCC SATELLITE

## 2015-10-25 ENCOUNTER — Encounter: Payer: Self-pay | Admitting: Hematology & Oncology

## 2015-11-02 ENCOUNTER — Other Ambulatory Visit (HOSPITAL_BASED_OUTPATIENT_CLINIC_OR_DEPARTMENT_OTHER): Payer: Commercial Managed Care - HMO

## 2015-11-02 DIAGNOSIS — R519 Headache, unspecified: Secondary | ICD-10-CM

## 2015-11-02 DIAGNOSIS — D473 Essential (hemorrhagic) thrombocythemia: Secondary | ICD-10-CM

## 2015-11-02 DIAGNOSIS — R51 Headache: Principal | ICD-10-CM

## 2015-11-02 LAB — CBC WITH DIFFERENTIAL (CANCER CENTER ONLY)
BASO#: 0.1 10*3/uL (ref 0.0–0.2)
BASO%: 0.6 % (ref 0.0–2.0)
EOS ABS: 0 10*3/uL (ref 0.0–0.5)
EOS%: 0.2 % (ref 0.0–7.0)
HCT: 38.2 % — ABNORMAL LOW (ref 38.7–49.9)
HGB: 12.4 g/dL — ABNORMAL LOW (ref 13.0–17.1)
LYMPH#: 1.6 10*3/uL (ref 0.9–3.3)
LYMPH%: 16.9 % (ref 14.0–48.0)
MCH: 28.8 pg (ref 28.0–33.4)
MCHC: 32.5 g/dL (ref 32.0–35.9)
MCV: 89 fL (ref 82–98)
MONO#: 1.8 10*3/uL — ABNORMAL HIGH (ref 0.1–0.9)
MONO%: 18.6 % — ABNORMAL HIGH (ref 0.0–13.0)
NEUT#: 6 10*3/uL (ref 1.5–6.5)
NEUT%: 63.7 % (ref 40.0–80.0)
RBC: 4.3 10*6/uL (ref 4.20–5.70)
RDW: 30.2 % — AB (ref 11.1–15.7)
WBC: 9.4 10*3/uL (ref 4.0–10.0)

## 2015-11-02 LAB — COMPREHENSIVE METABOLIC PANEL (CC13)
A/G RATIO: 2 (ref 1.1–2.5)
ALBUMIN: 4.4 g/dL (ref 3.5–5.5)
ALK PHOS: 58 IU/L (ref 39–117)
ALT: 13 IU/L (ref 0–44)
AST: 14 IU/L (ref 0–40)
BUN / CREAT RATIO: 20 (ref 9–20)
BUN: 15 mg/dL (ref 6–24)
Bilirubin Total: 0.3 mg/dL (ref 0.0–1.2)
Calcium, Ser: 9.2 mg/dL (ref 8.7–10.2)
Carbon Dioxide, Total: 27 mmol/L (ref 18–29)
Chloride, Ser: 101 mmol/L (ref 96–106)
Creatinine, Ser: 0.76 mg/dL (ref 0.76–1.27)
GFR calc Af Amer: 126 mL/min/{1.73_m2} (ref >59)
GFR calc non Af Amer: 109 mL/min/{1.73_m2} (ref >59)
GLOBULIN, TOTAL: 2.2 g/dL (ref 1.5–4.5)
Glucose: 118 mg/dL — ABNORMAL HIGH (ref 65–99)
POTASSIUM: 3.9 mmol/L (ref 3.5–5.2)
SODIUM: 135 mmol/L (ref 134–144)
Total Protein: 6.6 g/dL (ref 6.0–8.5)

## 2015-11-02 LAB — CHCC SATELLITE - SMEAR

## 2015-11-02 LAB — TECHNOLOGIST REVIEW CHCC SATELLITE

## 2015-11-05 ENCOUNTER — Other Ambulatory Visit: Payer: Commercial Managed Care - HMO

## 2015-11-05 LAB — FERRITIN: Ferritin: 27 ng/ml (ref 22–316)

## 2015-11-05 LAB — LACTATE DEHYDROGENASE: LDH: 211 U/L (ref 125–245)

## 2015-11-05 LAB — IRON AND TIBC
%SAT: 12 % — ABNORMAL LOW (ref 20–55)
IRON: 38 ug/dL — AB (ref 42–163)
TIBC: 314 ug/dL (ref 202–409)
UIBC: 275 ug/dL (ref 117–376)

## 2015-11-09 ENCOUNTER — Other Ambulatory Visit: Payer: Commercial Managed Care - HMO

## 2015-11-12 ENCOUNTER — Other Ambulatory Visit: Payer: Commercial Managed Care - HMO

## 2015-11-12 ENCOUNTER — Ambulatory Visit: Payer: Commercial Managed Care - HMO | Admitting: Hematology & Oncology

## 2015-11-15 ENCOUNTER — Ambulatory Visit (HOSPITAL_BASED_OUTPATIENT_CLINIC_OR_DEPARTMENT_OTHER): Payer: Commercial Managed Care - HMO | Admitting: Hematology & Oncology

## 2015-11-15 ENCOUNTER — Other Ambulatory Visit (HOSPITAL_BASED_OUTPATIENT_CLINIC_OR_DEPARTMENT_OTHER): Payer: Commercial Managed Care - HMO

## 2015-11-15 ENCOUNTER — Encounter: Payer: Self-pay | Admitting: Hematology & Oncology

## 2015-11-15 ENCOUNTER — Telehealth: Payer: Self-pay | Admitting: *Deleted

## 2015-11-15 VITALS — BP 115/62 | HR 79 | Temp 98.0°F | Resp 16 | Ht 73.0 in | Wt 271.0 lb

## 2015-11-15 DIAGNOSIS — D473 Essential (hemorrhagic) thrombocythemia: Secondary | ICD-10-CM

## 2015-11-15 DIAGNOSIS — R51 Headache: Principal | ICD-10-CM

## 2015-11-15 DIAGNOSIS — D471 Chronic myeloproliferative disease: Secondary | ICD-10-CM

## 2015-11-15 DIAGNOSIS — R519 Headache, unspecified: Secondary | ICD-10-CM

## 2015-11-15 LAB — CBC WITH DIFFERENTIAL (CANCER CENTER ONLY)
BASO#: 0.1 10*3/uL (ref 0.0–0.2)
BASO%: 0.6 % (ref 0.0–2.0)
EOS ABS: 0 10*3/uL (ref 0.0–0.5)
EOS%: 0.3 % (ref 0.0–7.0)
HCT: 35.6 % — ABNORMAL LOW (ref 38.7–49.9)
HEMOGLOBIN: 11.6 g/dL — AB (ref 13.0–17.1)
LYMPH#: 1.9 10*3/uL (ref 0.9–3.3)
LYMPH%: 17.3 % (ref 14.0–48.0)
MCH: 30.3 pg (ref 28.0–33.4)
MCHC: 32.6 g/dL (ref 32.0–35.9)
MCV: 93 fL (ref 82–98)
MONO#: 2.2 10*3/uL — AB (ref 0.1–0.9)
MONO%: 20.4 % — ABNORMAL HIGH (ref 0.0–13.0)
NEUT%: 61.4 % (ref 40.0–80.0)
NEUTROS ABS: 6.6 10*3/uL — AB (ref 1.5–6.5)
RBC: 3.83 10*6/uL — ABNORMAL LOW (ref 4.20–5.70)
RDW: 32.4 % — ABNORMAL HIGH (ref 11.1–15.7)
WBC: 10.8 10*3/uL — AB (ref 4.0–10.0)

## 2015-11-15 LAB — COMPREHENSIVE METABOLIC PANEL
ALT: 13 U/L (ref 0–55)
ANION GAP: 7 meq/L (ref 3–11)
AST: 20 U/L (ref 5–34)
Albumin: 3.7 g/dL (ref 3.5–5.0)
Alkaline Phosphatase: 57 U/L (ref 40–150)
BUN: 14 mg/dL (ref 7.0–26.0)
CALCIUM: 9 mg/dL (ref 8.4–10.4)
CHLORIDE: 104 meq/L (ref 98–109)
CO2: 27 meq/L (ref 22–29)
Creatinine: 1 mg/dL (ref 0.7–1.3)
Glucose: 154 mg/dl — ABNORMAL HIGH (ref 70–140)
POTASSIUM: 4.4 meq/L (ref 3.5–5.1)
Sodium: 138 mEq/L (ref 136–145)
Total Bilirubin: 0.35 mg/dL (ref 0.20–1.20)
Total Protein: 6.7 g/dL (ref 6.4–8.3)

## 2015-11-15 LAB — TECHNOLOGIST REVIEW CHCC SATELLITE

## 2015-11-15 MED ORDER — ANAGRELIDE HCL 1 MG PO CAPS: 1.0000 mg | ORAL_CAPSULE | Freq: Two times a day (BID) | ORAL | Status: DC

## 2015-11-15 NOTE — Telephone Encounter (Signed)
Called Dr. Jerrye Noble office new patient coordinator per dr. Marin Olp.  Patient appointment is Monday February 27th at the [facility] on the 3rd floor.  Patient contacted.

## 2015-11-15 NOTE — Progress Notes (Signed)
Hematology and Oncology Follow Up Visit  Patrick Blake [id number] 02-13-1971 45 y.o. 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 Unfortunately, it is apparent that his platelets are trending up  despite the Hydrea. He was doing quite well. His platelet count was 2 million when we first saw him. It then went down to 914,000. Now, is back up to almost 1,400,000.  He does have this chromosomal abnormalities with a application of 1q. This might be a factor.  He's had no problems with the Hydrea. He's had no nausea or vomiting. He's had no bleeding. He's had no diarrhea. He's had no pain or redness in his hands or feet. He's had no headache.  His blood sugars have been on the high side.  He's had no problems with mouth sores.  He denies any count of headache.  He and his wife are going to the mountains today. They will be going for a long-awaited vacation.   Medications:  Current outpatient prescriptions:  .  aspirin 81 MG chewable tablet, Chew 81 mg by mouth., Disp: , Rfl:  .  ezetimibe (ZETIA) 10 MG tablet, Take 10 mg by mouth., Disp: , Rfl:  .  hydroxyurea (HYDREA) 500 MG capsule, Take 2 pills TWICE a day. May take with food to minimize GI side effects., Disp: 120 capsule, Rfl: 3 .  indapamide (LOZOL) 1.25 MG tablet, Take 1.25 mg by mouth., Disp: , Rfl:  .  insulin aspart (NOVOLOG) 100 UNIT/ML injection, Inject into the skin., Disp: , Rfl:  .  ondansetron (ZOFRAN) 8 MG tablet, Take 1 tablet (8 mg total) by mouth every 8 (eight) hours as needed for nausea or vomiting., Disp: 30 tablet, Rfl: 2 .  ONE TOUCH ULTRA TEST test strip, USE AS DIRECTED. TESTING FREQUENCY: 2-4 TIMES DAILY., Disp: , Rfl: 11 .  pioglitazone (ACTOS) 30 MG tablet, Take 30  mg by mouth., Disp: , Rfl:  .  ramipril (ALTACE) 10 MG capsule, Take 10 mg by mouth., Disp: , Rfl:  .  TOUJEO SOLOSTAR 300 UNIT/ML SOPN, TAKE 40 UNITS BEFORE BREAKFAST DAILY, Disp: , Rfl: 12 .  anagrelide (AGRYLIN) 1 MG capsule, Take 1 capsule (1 mg total) by mouth 2 (two) times daily., Disp: 60 capsule, Rfl: 3  Allergies: Not on File  Past Medical History, Surgical history, Social history, and Family History were reviewed and updated.  Review of Systems: As above  Physical Exam:  height is 6\' 1"  (1.854 m) and weight is 271 lb (122.925 kg). His oral temperature is 98 F (36.7 C). His blood pressure is 115/62 and his pulse is 79. His respiration is 16.   Wt Readings from Last 3 Encounters:  11/15/15 271 lb (122.925 kg)  10/08/15 275 lb (124.739 kg)  09/21/15 268 lb (121.564 kg)     Obese white male in no obvious distress. Head and neck exam shows no ocular or oral lesions. He has no palpable cervical or supraclavicular lymph nodes. Lungs are clear. Cardiac exam regular rate and rhythm with no murmurs, rubs or bruits. Abdomen is soft. He has good bowel sounds. There is no fluid wave. There is no palpable liver or spleen tip. Back exam shows no tenderness over the spine, ribs or hips. Extremity  shows no clubbing, cyanosis or edema. Skin exam shows no rashes, ecchymoses or petechia. Neurological exam shows no focal neurological deficits.  Lab Results  Component Value Date   WBC 10.8* 11/15/2015   HGB 11.6* 11/15/2015   HCT 35.6* 11/15/2015   MCV 93 11/15/2015   PLT 1,378* 11/15/2015     Chemistry      Component Value Date/Time   NA 135 11/02/2015 1424   NA 139 10/22/2015 1406   NA 136 09/21/2015 0841   NA 139 05/18/2007 1542   K 3.9 11/02/2015 1424   K 4.0 10/22/2015 1406   K 4.0 09/21/2015 0841   K 4.5 05/18/2007 1542   CL 101 11/02/2015 1424   CL 100 10/22/2015 1406   CL 103 05/18/2007 1542   CO2 27 11/02/2015 1424   CO2 28 10/22/2015 1406   CO2 24 09/21/2015 0841    BUN 15 11/02/2015 1424   BUN 14 10/22/2015 1406   BUN 13.8 09/21/2015 0841   BUN 11 05/18/2007 1542   CREATININE 0.76 11/02/2015 1424   CREATININE 0.9 10/22/2015 1406   CREATININE 0.9 09/21/2015 0841      Component Value Date/Time   CALCIUM 9.2 11/02/2015 1424   CALCIUM 8.3 10/22/2015 1406   CALCIUM 9.2 09/21/2015 0841   ALKPHOS 58 11/02/2015 1424   ALKPHOS 43 10/22/2015 1406   ALKPHOS 52 09/21/2015 0841   AST 14 11/02/2015 1424   AST 20 10/22/2015 1406   AST 14 09/21/2015 0841   ALT 13 11/02/2015 1424   ALT 18 10/22/2015 1406   ALT 13 09/21/2015 0841   BILITOT 0.3 11/02/2015 1424   BILITOT 0.70 10/22/2015 1406   BILITOT 0.66 09/21/2015 0841         Impression and Plan: Patrick Blake is a 45 year old white male. He has essential thrombocythemia. He has "triple negative" disease. All of his mutation analyses have been normal. 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 Somehow, I just think we are going to have to refer him to one academic centers. I think that getting him to [facility] would be reasonable.  I want to switch him over to anagrelide. I think this would be a good option for him for right now. I do not want to do this until he gets back from his little vacation this weekend. I will put him on 2 mg a day of anagrelide.    I spent about 40 minutes with he and his wife. I am just surprised that his platelet count is going back up.    I would not think that this is some form of acute leukemia. Megakaryocytic leukemia I suppose could be a remote possibility.  It will be very interesting to see what a second opinion says about the bone marrow. It is possibly he may need to have another bone marrow test done.  I will try to get him set up for a consultation in the next week.  He still has appointments to come back to see [place] for lab work.  I am just surprised by the fact that the Hydrea does not appear to be effective right now.  At  least, over the weekend, I will have him on 1000 mg 3 times a day of Hydrea. Once he gets back from his vacation, then he will start the Hydrea.     [name], MD 2/16/20172:42 PM

## 2015-11-16 ENCOUNTER — Other Ambulatory Visit: Payer: Commercial Managed Care - HMO

## 2015-11-19 ENCOUNTER — Other Ambulatory Visit: Payer: Commercial Managed Care - HMO

## 2015-11-23 ENCOUNTER — Other Ambulatory Visit: Payer: Commercial Managed Care - HMO

## 2015-11-26 DIAGNOSIS — C946 Myelodysplastic disease, not classified: Secondary | ICD-10-CM

## 2015-11-26 HISTORY — DX: Myelodysplastic disease, not elsewhere classified: C94.6

## 2015-11-30 ENCOUNTER — Other Ambulatory Visit: Payer: Commercial Managed Care - HMO

## 2015-12-03 ENCOUNTER — Other Ambulatory Visit: Payer: Commercial Managed Care - HMO

## 2015-12-07 ENCOUNTER — Other Ambulatory Visit: Payer: Commercial Managed Care - HMO

## 2015-12-14 ENCOUNTER — Ambulatory Visit: Payer: Commercial Managed Care - HMO | Admitting: Hematology & Oncology

## 2015-12-14 ENCOUNTER — Other Ambulatory Visit: Payer: Commercial Managed Care - HMO

## 2015-12-17 ENCOUNTER — Other Ambulatory Visit: Payer: Commercial Managed Care - HMO

## 2015-12-21 ENCOUNTER — Other Ambulatory Visit: Payer: Commercial Managed Care - HMO

## 2015-12-28 ENCOUNTER — Other Ambulatory Visit (HOSPITAL_BASED_OUTPATIENT_CLINIC_OR_DEPARTMENT_OTHER): Payer: Commercial Managed Care - HMO

## 2015-12-28 DIAGNOSIS — D473 Essential (hemorrhagic) thrombocythemia: Secondary | ICD-10-CM

## 2015-12-28 DIAGNOSIS — D471 Chronic myeloproliferative disease: Secondary | ICD-10-CM

## 2015-12-28 LAB — IRON AND TIBC
%SAT: 32 % (ref 20–55)
Iron: 89 ug/dL (ref 42–163)
TIBC: 278 ug/dL (ref 202–409)
UIBC: 188 ug/dL (ref 117–376)

## 2015-12-28 LAB — COMPREHENSIVE METABOLIC PANEL
ALBUMIN: 3.8 g/dL (ref 3.5–5.0)
ALK PHOS: 55 U/L (ref 40–150)
ALT: 14 U/L (ref 0–55)
ANION GAP: 8 meq/L (ref 3–11)
AST: 20 U/L (ref 5–34)
BILIRUBIN TOTAL: 0.47 mg/dL (ref 0.20–1.20)
BUN: 15.7 mg/dL (ref 7.0–26.0)
CALCIUM: 9.3 mg/dL (ref 8.4–10.4)
CHLORIDE: 105 meq/L (ref 98–109)
CO2: 27 mEq/L (ref 22–29)
CREATININE: 0.8 mg/dL (ref 0.7–1.3)
EGFR: >90 mL/min/{1.73_m2} (ref >90)
Glucose: 78 mg/dl (ref 70–140)
Potassium: 4 mEq/L (ref 3.5–5.1)
Sodium: 140 mEq/L (ref 136–145)
TOTAL PROTEIN: 6.6 g/dL (ref 6.4–8.3)

## 2015-12-28 LAB — CBC WITH DIFFERENTIAL (CANCER CENTER ONLY)
BASO#: 0 10*3/uL (ref 0.0–0.2)
BASO%: 0.9 % (ref 0.0–2.0)
EOS%: 0.2 % (ref 0.0–7.0)
Eosinophils Absolute: 0 10*3/uL (ref 0.0–0.5)
HEMATOCRIT: 39.8 % (ref 38.7–49.9)
HGB: 13.4 g/dL (ref 13.0–17.1)
LYMPH#: 0.9 10*3/uL (ref 0.9–3.3)
LYMPH%: 21.4 % (ref 14.0–48.0)
MCH: 35.2 pg — ABNORMAL HIGH (ref 28.0–33.4)
MCHC: 33.7 g/dL (ref 32.0–35.9)
MCV: 105 fL — ABNORMAL HIGH (ref 82–98)
MONO#: 1 10*3/uL — ABNORMAL HIGH (ref 0.1–0.9)
MONO%: 23.7 % — AB (ref 0.0–13.0)
NEUT%: 53.8 % (ref 40.0–80.0)
NEUTROS ABS: 2.3 10*3/uL (ref 1.5–6.5)
PLATELETS: 589 10*3/uL — AB (ref 145–400)
RBC: 3.81 10*6/uL — ABNORMAL LOW (ref 4.20–5.70)
WBC: 4.3 10*3/uL (ref 4.0–10.0)

## 2015-12-28 LAB — CHCC SATELLITE - SMEAR

## 2015-12-28 LAB — LACTATE DEHYDROGENASE: LDH: 209 U/L (ref 125–245)

## 2015-12-28 LAB — FERRITIN: FERRITIN: 79 ng/mL (ref 22–316)

## 2015-12-29 LAB — RETICULOCYTES: RETICULOCYTE COUNT: 0.9 % (ref 0.6–2.6)

## 2015-12-31 ENCOUNTER — Other Ambulatory Visit: Payer: Commercial Managed Care - HMO

## 2016-01-02 ENCOUNTER — Other Ambulatory Visit: Payer: Self-pay | Admitting: Hematology & Oncology

## 2016-01-02 DIAGNOSIS — D473 Essential (hemorrhagic) thrombocythemia: Secondary | ICD-10-CM

## 2016-01-04 ENCOUNTER — Other Ambulatory Visit (HOSPITAL_BASED_OUTPATIENT_CLINIC_OR_DEPARTMENT_OTHER): Payer: Commercial Managed Care - HMO

## 2016-01-04 ENCOUNTER — Other Ambulatory Visit: Payer: Commercial Managed Care - HMO

## 2016-01-04 DIAGNOSIS — D473 Essential (hemorrhagic) thrombocythemia: Secondary | ICD-10-CM | POA: Diagnosis not present

## 2016-01-04 DIAGNOSIS — R51 Headache: Principal | ICD-10-CM

## 2016-01-04 DIAGNOSIS — R519 Headache, unspecified: Secondary | ICD-10-CM

## 2016-01-04 LAB — CMP (CANCER CENTER ONLY)
ALK PHOS: 42 U/L (ref 26–84)
ALT: 17 U/L (ref 10–47)
AST: 19 U/L (ref 11–38)
Albumin: 3.5 g/dL (ref 3.3–5.5)
BILIRUBIN TOTAL: 0.7 mg/dL (ref 0.20–1.60)
BUN: 12 mg/dL (ref 7–22)
CALCIUM: 9 mg/dL (ref 8.0–10.3)
CO2: 29 meq/L (ref 18–33)
Chloride: 101 mEq/L (ref 98–108)
Creat: 1 mg/dl (ref 0.6–1.2)
GLUCOSE: 202 mg/dL — AB (ref 73–118)
Potassium: 4.4 mEq/L (ref 3.3–4.7)
Sodium: 136 mEq/L (ref 128–145)
Total Protein: 6.2 g/dL — ABNORMAL LOW (ref 6.4–8.1)

## 2016-01-04 LAB — MANUAL DIFFERENTIAL (CHCC SATELLITE)
ALC: 1 10*3/uL (ref 0.9–3.3)
ANC (CHCC HP manual diff): 4.2 10*3/uL (ref 1.5–6.5)
Band Neutrophils: 3 % (ref 0–10)
LYMPH: 13 % — AB (ref 14–48)
MONO: 25 % — AB (ref 0–13)
Metamyelocytes: 1 % — ABNORMAL HIGH (ref 0–0)
Myelocytes: 1 % — ABNORMAL HIGH (ref 0–0)
Other Comments: 1
PLT EST ~~LOC~~: INCREASED
SEG: 56 % (ref 40–75)
VARIANT LYMPH: 1 % — AB (ref 0–0)

## 2016-01-04 LAB — CBC WITH DIFFERENTIAL (CANCER CENTER ONLY)
HCT: 37.3 % — ABNORMAL LOW (ref 38.7–49.9)
HGB: 12.6 g/dL — ABNORMAL LOW (ref 13.0–17.1)
MCH: 35.8 pg — AB (ref 28.0–33.4)
MCHC: 33.8 g/dL (ref 32.0–35.9)
MCV: 106 fL — AB (ref 82–98)
Platelets: 566 10*3/uL — ABNORMAL HIGH (ref 145–400)
RBC: 3.52 10*6/uL — AB (ref 4.20–5.70)
WBC: 6.9 10*3/uL (ref 4.0–10.0)

## 2016-01-05 ENCOUNTER — Ambulatory Visit (HOSPITAL_BASED_OUTPATIENT_CLINIC_OR_DEPARTMENT_OTHER)
Admission: RE | Admit: 2016-01-05 | Discharge: 2016-01-05 | Disposition: A | Discharge status: Home / Self Care | Payer: Commercial Managed Care - HMO | Source: Ambulatory Visit | Attending: Hematology & Oncology | Admitting: Hematology & Oncology

## 2016-01-05 DIAGNOSIS — D473 Essential (hemorrhagic) thrombocythemia: Secondary | ICD-10-CM | POA: Insufficient documentation

## 2016-01-05 MED ORDER — GADOBENATE DIMEGLUMINE 529 MG/ML IV SOLN
20.0000 mL | Freq: Once | INTRAVENOUS | Status: AC | PRN
Start: 2016-01-05 — End: 2016-01-05
  Administered 2016-01-05: 20 mL via INTRAVENOUS

## 2016-01-07 ENCOUNTER — Encounter: Payer: Self-pay | Admitting: Nurse Practitioner

## 2016-01-09 ENCOUNTER — Telehealth: Payer: Self-pay | Admitting: *Deleted

## 2016-01-09 NOTE — Telephone Encounter (Addendum)
Patient aware of results.   ----- Message from [name], MD sent at 01/07/2016  5:22 PM EDT ----- Call - let him know that the MRI of the brain is normal~!!  Thanks!!  Laurey Arrow

## 2016-01-10 ENCOUNTER — Other Ambulatory Visit: Payer: Commercial Managed Care - HMO

## 2016-01-11 ENCOUNTER — Other Ambulatory Visit: Payer: Commercial Managed Care - HMO

## 2016-01-14 ENCOUNTER — Other Ambulatory Visit: Payer: Commercial Managed Care - HMO

## 2016-01-16 ENCOUNTER — Telehealth: Payer: Self-pay | Admitting: Hematology & Oncology

## 2016-01-16 ENCOUNTER — Other Ambulatory Visit: Payer: Self-pay | Admitting: Hematology & Oncology

## 2016-01-16 NOTE — Telephone Encounter (Signed)
Patient's wife called and cx 01/18/16 lab apt

## 2016-01-18 ENCOUNTER — Other Ambulatory Visit: Payer: Commercial Managed Care - HMO

## 2016-01-18 ENCOUNTER — Other Ambulatory Visit: Payer: Self-pay | Admitting: Hematology & Oncology

## 2016-01-18 ENCOUNTER — Encounter: Payer: Self-pay | Admitting: Hematology & Oncology

## 2016-01-18 DIAGNOSIS — D469 Myelodysplastic syndrome, unspecified: Secondary | ICD-10-CM

## 2016-01-18 HISTORY — DX: Myelodysplastic syndrome, unspecified: D46.9

## 2016-01-21 ENCOUNTER — Encounter: Payer: Self-pay | Admitting: Hematology & Oncology

## 2016-01-21 ENCOUNTER — Ambulatory Visit (HOSPITAL_BASED_OUTPATIENT_CLINIC_OR_DEPARTMENT_OTHER): Payer: Commercial Managed Care - HMO | Admitting: Hematology & Oncology

## 2016-01-21 ENCOUNTER — Encounter: Payer: Self-pay | Admitting: *Deleted

## 2016-01-21 ENCOUNTER — Other Ambulatory Visit (HOSPITAL_BASED_OUTPATIENT_CLINIC_OR_DEPARTMENT_OTHER): Payer: Commercial Managed Care - HMO

## 2016-01-21 ENCOUNTER — Ambulatory Visit (HOSPITAL_BASED_OUTPATIENT_CLINIC_OR_DEPARTMENT_OTHER): Payer: Commercial Managed Care - HMO

## 2016-01-21 VITALS — BP 122/67 | HR 68 | Temp 98.2°F | Resp 18

## 2016-01-21 DIAGNOSIS — E119 Type 2 diabetes mellitus without complications: Secondary | ICD-10-CM | POA: Diagnosis not present

## 2016-01-21 DIAGNOSIS — D469 Myelodysplastic syndrome, unspecified: Secondary | ICD-10-CM

## 2016-01-21 DIAGNOSIS — D47Z9 Other specified neoplasms of uncertain behavior of lymphoid, hematopoietic and related tissue: Secondary | ICD-10-CM

## 2016-01-21 DIAGNOSIS — D479 Neoplasm of uncertain behavior of lymphoid, hematopoietic and related tissue, unspecified: Secondary | ICD-10-CM | POA: Diagnosis not present

## 2016-01-21 DIAGNOSIS — D471 Chronic myeloproliferative disease: Secondary | ICD-10-CM

## 2016-01-21 LAB — CBC WITH DIFFERENTIAL (CANCER CENTER ONLY)
BASO#: 0 10*3/uL (ref 0.0–0.2)
BASO%: 0.5 % (ref 0.0–2.0)
EOS ABS: 0 10*3/uL (ref 0.0–0.5)
EOS%: 0.3 % (ref 0.0–7.0)
HCT: 42.7 % (ref 38.7–49.9)
HEMOGLOBIN: 14.5 g/dL (ref 13.0–17.1)
LYMPH#: 1.6 10*3/uL (ref 0.9–3.3)
LYMPH%: 20.7 % (ref 14.0–48.0)
MCH: 36.3 pg — AB (ref 28.0–33.4)
MCHC: 34 g/dL (ref 32.0–35.9)
MCV: 107 fL — ABNORMAL HIGH (ref 82–98)
MONO#: 1.5 10*3/uL — ABNORMAL HIGH (ref 0.1–0.9)
MONO%: 20.3 % — AB (ref 0.0–13.0)
NEUT%: 58.2 % (ref 40.0–80.0)
NEUTROS ABS: 4.4 10*3/uL (ref 1.5–6.5)
RBC: 4 10*6/uL — ABNORMAL LOW (ref 4.20–5.70)
RDW: 23.5 % — AB (ref 11.1–15.7)
WBC: 7.5 10*3/uL (ref 4.0–10.0)

## 2016-01-21 LAB — CMP (CANCER CENTER ONLY)
ALK PHOS: 53 U/L (ref 26–84)
ALT(SGPT): 20 U/L (ref 10–47)
AST: 20 U/L (ref 11–38)
Albumin: 3.6 g/dL (ref 3.3–5.5)
BILIRUBIN TOTAL: 0.9 mg/dL (ref 0.20–1.60)
BUN: 16 mg/dL (ref 7–22)
CHLORIDE: 102 meq/L (ref 98–108)
CO2: 30 meq/L (ref 18–33)
CREATININE: 1.2 mg/dL (ref 0.6–1.2)
Calcium: 9.2 mg/dL (ref 8.0–10.3)
GLUCOSE: 211 mg/dL — AB (ref 73–118)
Potassium: 5.4 mEq/L — ABNORMAL HIGH (ref 3.3–4.7)
SODIUM: 143 meq/L (ref 128–145)
Total Protein: 6.7 g/dL (ref 6.4–8.1)

## 2016-01-21 MED ORDER — PROCHLORPERAZINE MALEATE 10 MG PO TABS: 10.0000 mg | ORAL_TABLET | Freq: Four times a day (QID) | ORAL | Status: DC | PRN

## 2016-01-21 MED ORDER — SODIUM CHLORIDE 0.9 % IV SOLN
Freq: Once | INTRAVENOUS | Status: AC
  Administered 2016-01-21: 14:00:00 via INTRAVENOUS

## 2016-01-21 MED ORDER — PROCHLORPERAZINE MALEATE 10 MG PO TABS
10.0000 mg | ORAL_TABLET | Freq: Once | ORAL | Status: AC
  Administered 2016-01-21: 10 mg via ORAL

## 2016-01-21 MED ORDER — PROCHLORPERAZINE MALEATE 10 MG PO TABS
ORAL_TABLET | ORAL | Status: AC
  Filled 2016-01-21: qty 1

## 2016-01-21 MED ORDER — SODIUM CHLORIDE 0.9 % IV SOLN
20.0000 mg/m2 | Freq: Once | INTRAVENOUS | Status: AC
  Administered 2016-01-21: 50 mg via INTRAVENOUS
  Filled 2016-01-21: qty 10

## 2016-01-21 NOTE — Patient Instructions (Signed)
Decitabine injection for infusion What is this medicine? DECITABINE (dee SYE ta been) is a chemotherapy drug. This medicine reduces the growth of cancer cells. It is used to treat adults with myelodysplastic syndromes. This medicine may be used for other purposes; ask your health care provider or pharmacist if you have questions. What should I tell my health care provider before I take this medicine? They need to know if you have any of these conditions: -infection (especially a virus infection such as chickenpox, cold sores, or herpes) -kidney disease -liver disease -an unusual or allergic reaction to decitabine, other medicines, foods, dyes, or preservatives -pregnant or trying to get pregnant -breast-feeding How should I use this medicine? This medicine is for infusion into a vein. It is administered in a hospital or clinic by a doctor or health care professional. Talk to your pediatrician regarding the use of this medicine in children. Special care may be needed. Overdosage: If you think you have taken too much of this medicine contact a poison control center or emergency room at once. NOTE: This medicine is only for you. Do not share this medicine with others. What if I miss a dose? It is important not to miss your dose. Call your doctor or health care professional if you are unable to keep an appointment. What may interact with this medicine? -vaccines Talk to your doctor or health care professional before taking any of these medicines: -aspirin -acetaminophen -ibuprofen -ketoprofen -naproxen This list may not describe all possible interactions. Give your health care provider a list of all the medicines, herbs, non-prescription drugs, or dietary supplements you use. Also tell them if you smoke, drink alcohol, or use illegal drugs. Some items may interact with your medicine. What should I watch for while using this medicine? Visit your doctor for checks on your progress. This drug  may make you feel generally unwell. This is not uncommon, as chemotherapy can affect healthy cells as well as cancer cells. Report any side effects. Continue your course of treatment even though you feel ill unless your doctor tells you to stop. In some cases, you may be given additional medicines to help with side effects. Follow all directions for their use. Call your doctor or health care professional for advice if you get a fever, chills or sore throat, or other symptoms of a cold or flu. Do not treat yourself. This drug decreases your body's ability to fight infections. Try to avoid being around people who are sick. This medicine may increase your risk to bruise or bleed. Call your doctor or health care professional if you notice any unusual bleeding. Do not become pregnant while taking this medicine or for at least 1 month after stopping it. Women should inform their doctor if they wish to become pregnant or think they might be pregnant. Men should not father a child while taking this medicine and for at least 2 months after stopping it. There is a potential for serious side effects to an unborn child. Talk to your health care professional or pharmacist for more information. Do not breast-feed an infant while taking this medicine. What side effects may I notice from receiving this medicine? Side effects that you should report to your doctor or health care professional as soon as possible: -low blood counts - this medicine may decrease the number of white blood cells, red blood cells and platelets. You may be at increased risk for infections and bleeding. -signs of infection - fever or chills, cough, sore throat,   pain or difficulty passing urine -signs of decreased platelets or bleeding - bruising, pinpoint red spots on the skin, black, tarry stools, blood in the urine -signs of decreased red blood cells - unusual weakness or tiredness, fainting spells, lightheadedness -increased blood sugar Side  effects that usually do not require medical attention (report to your prescriber or health care professional if they continue or are bothersome): -constipation -diarrhea -headache -loss of appetite -nausea, vomiting -skin rash, itching -stomach pain -water retention -weak or tired This list may not describe all possible side effects. Call your doctor for medical advice about side effects. You may report side effects to FDA at 1-800-FDA-1088. Where should I keep my medicine? This drug is given in a hospital or clinic and will not be stored at home. NOTE: This sheet is a summary. It may not cover all possible information. If you have questions about this medicine, talk to your doctor, pharmacist, or health care provider.    2016, Elsevier/Gold Standard. (2015-04-17 12:56:02)  

## 2016-01-21 NOTE — Progress Notes (Signed)
Hematology and Oncology Follow Up Visit  Patrick Blake [id number] Sep 04, 1971 45 y.o. 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 We sent him out to [facility] for a second opinion. They repeated a bone marrow biopsy on him. 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 He had an increased number of blasts.  They felt that he would benefit from a alginate bone marrow transplant. As such, he is being worked up for this. In the meantime, they wished that he start treatment to try to help control his blood counts and to help with his last percentage. We have started him on Dacogen. I talked to him about this. The doctors at [facility] talk to him about this. He understands was some of the side effects are.   He is having some problems with his left foot. He's not able to work for 3 weeks.   He still has his diabetes. He is on the insulin pump. He is tolerating this pretty well.  He's had no bleeding. He's had no rashes. He's had no fever. He's had no change in bowel or bladder habits.  Overall, his performance status is ECOG 1.   Medications:  Current outpatient prescriptions:  .  aspirin 81 MG chewable tablet, Chew 81 mg by mouth., Disp: , Rfl:  .  ezetimibe (ZETIA) 10 MG tablet, Take 10 mg by mouth., Disp: , Rfl:  .  indapamide (LOZOL) 1.25 MG tablet, Take 1.25 mg by mouth., Disp: , Rfl:  .  insulin aspart (NOVOLOG) 100 UNIT/ML injection, Inject into the skin., Disp: , Rfl:  .  Multiple Vitamins-Iron (MULTI-VITAMIN/IRON PO), Take by mouth., Disp: , Rfl:  .  ondansetron (ZOFRAN) 8 MG tablet, Take 1 tablet (8 mg total) by mouth every 8 (eight) hours as needed for nausea or vomiting. (Patient not taking: Reported on 01/21/2016), Disp:  30 tablet, Rfl: 2 .  ONE TOUCH ULTRA TEST test strip, USE AS DIRECTED. TESTING FREQUENCY: 2-4 TIMES DAILY., Disp: , Rfl: 11 .  pioglitazone (ACTOS) 30 MG tablet, Take 30 mg by mouth., Disp: , Rfl:  .  prochlorperazine (COMPAZINE) 10 MG tablet, Take 1 tablet (10 mg total) by mouth every 6 (six) hours as needed (Nausea or vomiting)., Disp: 30 tablet, Rfl: 1 .  ramipril (ALTACE) 10 MG capsule, Take 10 mg by mouth., Disp: , Rfl:  .  TOUJEO SOLOSTAR 300 UNIT/ML SOPN, TAKE 40 UNITS BEFORE BREAKFAST DAILY, Disp: , Rfl: 12  Allergies: Not on File  Past Medical History, Surgical history, Social history, and Family History were reviewed and updated.  Review of Systems: As above  Physical Exam:  vitals were not taken for this visit.  Wt Readings from Last 3 Encounters:  11/15/15 271 lb (122.925 kg)  10/08/15 275 lb (124.739 kg)  09/21/15 268 lb (121.564 kg)     Obese white male in no obvious distress. Head and neck exam shows no ocular or oral lesions. He has no palpable cervical or supraclavicular lymph nodes. Lungs are clear. Cardiac exam regular rate and rhythm with no murmurs, rubs or bruits. Abdomen is soft. He has good bowel sounds. There is no fluid wave. There is no palpable liver or spleen tip. Back exam  shows no tenderness over the spine, ribs or hips. Extremity shows no clubbing, cyanosis or edema. Skin exam shows no rashes, ecchymoses or petechia. Neurological exam shows no focal neurological deficits.  Lab Results  Component Value Date   WBC 7.5 01/21/2016   HGB 14.5 01/21/2016   HCT 42.7 01/21/2016   MCV 107* 01/21/2016   PLT 1,007 Large platelets present* 01/21/2016     Chemistry      Component Value Date/Time   NA 143 01/21/2016 1300   NA 140 12/28/2015 1006   NA 135 11/02/2015 1424   NA 139 05/18/2007 1542   K 5.4* 01/21/2016 1300   K 4.0 12/28/2015 1006   K 3.9 11/02/2015 1424   K 4.5 05/18/2007 1542   CL 102 01/21/2016 1300   CL 101 11/02/2015 1424   CL 103  05/18/2007 1542   CO2 30 01/21/2016 1300   CO2 27 12/28/2015 1006   CO2 27 11/02/2015 1424   BUN 16 01/21/2016 1300   BUN 15.7 12/28/2015 1006   BUN 15 11/02/2015 1424   BUN 11 05/18/2007 1542   CREATININE 1.2 01/21/2016 1300   CREATININE 0.8 12/28/2015 1006   CREATININE 0.76 11/02/2015 1424      Component Value Date/Time   CALCIUM 9.2 01/21/2016 1300   CALCIUM 9.3 12/28/2015 1006   CALCIUM 9.2 11/02/2015 1424   ALKPHOS 53 01/21/2016 1300   ALKPHOS 55 12/28/2015 1006   ALKPHOS 58 11/02/2015 1424   AST 20 01/21/2016 1300   AST 20 12/28/2015 1006   AST 14 11/02/2015 1424   ALT 20 01/21/2016 1300   ALT 14 12/28/2015 1006   ALT 13 11/02/2015 1424   BILITOT 0.90 01/21/2016 1300   BILITOT 0.47 12/28/2015 1006   BILITOT 0.3 11/02/2015 1424         Impression and Plan: Patrick Blake is a 45 year old white male. He has a hybrid myelodysplastic/myeloproliferative disorder. I suspect that the fact that he has increased blasts is what is driving the decisions for therapy.   I think that the Dacogen will be a good idea. I think we should see a good response rate. His platelet count is up quite a bit. This really is no surprise. He's on no therapy right now.  We will go ahead and get him started today. We'll treat for 5 days around and off for 3 weeks.  This is truly a very, complicated case. He is young. He has a good performance status. I think that he should be able to respond well.  He may need to be transfused. I talked to him about this. He does understand this.  He will need weekly labs for [place] to follow.  I probably will get him back in about 2 or 3 weeks suing see how he is doing.  I spent about 40 minutes with he and his wife today.[name], MD 4/24/20175:49 PM

## 2016-01-22 ENCOUNTER — Encounter: Payer: Self-pay | Admitting: Hematology & Oncology

## 2016-01-22 ENCOUNTER — Ambulatory Visit (HOSPITAL_BASED_OUTPATIENT_CLINIC_OR_DEPARTMENT_OTHER): Payer: Commercial Managed Care - HMO

## 2016-01-22 VITALS — BP 130/80 | HR 84 | Temp 98.1°F | Resp 20 | Wt 269.5 lb

## 2016-01-22 DIAGNOSIS — D469 Myelodysplastic syndrome, unspecified: Secondary | ICD-10-CM

## 2016-01-22 DIAGNOSIS — D479 Neoplasm of uncertain behavior of lymphoid, hematopoietic and related tissue, unspecified: Secondary | ICD-10-CM | POA: Diagnosis not present

## 2016-01-22 DIAGNOSIS — D47Z9 Other specified neoplasms of uncertain behavior of lymphoid, hematopoietic and related tissue: Secondary | ICD-10-CM

## 2016-01-22 MED ORDER — SODIUM CHLORIDE 0.9 % IV SOLN
20.0000 mg/m2 | Freq: Once | INTRAVENOUS | Status: AC
  Administered 2016-01-22: 50 mg via INTRAVENOUS
  Filled 2016-01-22: qty 10

## 2016-01-22 MED ORDER — PROCHLORPERAZINE MALEATE 10 MG PO TABS
ORAL_TABLET | ORAL | Status: AC
  Filled 2016-01-22: qty 1

## 2016-01-22 MED ORDER — PROCHLORPERAZINE MALEATE 10 MG PO TABS
10.0000 mg | ORAL_TABLET | Freq: Once | ORAL | Status: AC
  Administered 2016-01-22: 10 mg via ORAL

## 2016-01-22 MED ORDER — SODIUM CHLORIDE 0.9 % IV SOLN
Freq: Once | INTRAVENOUS | Status: AC
  Administered 2016-01-22: 12:00:00 via INTRAVENOUS

## 2016-01-22 NOTE — Patient Instructions (Signed)
[facility] Discharge Instructions for Patients Receiving Chemotherapy  Today you received the following chemotherapy agents Dacogen (Decitabine)  To help prevent nausea and vomiting after your treatment, we encourage you to take your nausea medications as directed.   If you develop nausea and vomiting that is not controlled by your nausea medication, call the clinic.   BELOW ARE SYMPTOMS THAT SHOULD BE REPORTED IMMEDIATELY:  *FEVER GREATER THAN 100.5 F  *CHILLS WITH OR WITHOUT FEVER  NAUSEA AND VOMITING THAT IS NOT CONTROLLED WITH YOUR NAUSEA MEDICATION  *UNUSUAL SHORTNESS OF BREATH  *UNUSUAL BRUISING OR BLEEDING  TENDERNESS IN MOUTH AND THROAT WITH OR WITHOUT PRESENCE OF ULCERS  *URINARY PROBLEMS  *BOWEL PROBLEMS  UNUSUAL RASH Items with * indicate a potential emergency and should be followed up as soon as possible.  Feel free to call the clinic you have any questions or concerns. The clinic phone number is [phone].  Please show the [place] at check-in to the Emergency Department and triage nurse.  Decitabine injection for infusion What is this medicine? DECITABINE (dee SYE ta been) is a chemotherapy drug. This medicine reduces the growth of cancer cells. It is used to treat adults with myelodysplastic syndromes. This medicine may be used for other purposes; ask your health care provider or pharmacist if you have questions. What should I tell my health care provider before I take this medicine? They need to know if you have any of these conditions: -infection (especially a virus infection such as chickenpox, cold sores, or herpes) -kidney disease -liver disease -an unusual or allergic reaction to decitabine, other medicines, foods, dyes, or preservatives -pregnant or trying to get pregnant -breast-feeding How should I use this medicine? This medicine is for infusion into a vein. It is administered in a hospital or clinic by a doctor or  health care professional. Talk to your pediatrician regarding the use of this medicine in children. Special care may be needed. Overdosage: If you think you have taken too much of this medicine contact a poison control center or emergency room at once. NOTE: This medicine is only for you. Do not share this medicine with others. What if I miss a dose? It is important not to miss your dose. Call your doctor or health care professional if you are unable to keep an appointment. What may interact with this medicine? -vaccines Talk to your doctor or health care professional before taking any of these medicines: -aspirin -acetaminophen -ibuprofen -ketoprofen -naproxen This list may not describe all possible interactions. Give your health care provider a list of all the medicines, herbs, non-prescription drugs, or dietary supplements you use. Also tell them if you smoke, drink alcohol, or use illegal drugs. Some items may interact with your medicine. What should I watch for while using this medicine? Visit your doctor for checks on your progress. This drug may make you feel generally unwell. This is not uncommon, as chemotherapy can affect healthy cells as well as cancer cells. Report any side effects. Continue your course of treatment even though you feel ill unless your doctor tells you to stop. In some cases, you may be given additional medicines to help with side effects. Follow all directions for their use. Call your doctor or health care professional for advice if you get a fever, chills or sore throat, or other symptoms of a cold or flu. Do not treat yourself. This drug decreases your body's ability to fight infections. Try to avoid being around people  who are sick. This medicine may increase your risk to bruise or bleed. Call your doctor or health care professional if you notice any unusual bleeding. Do not become pregnant while taking this medicine or for at least 1 month after stopping it.  Women should inform their doctor if they wish to become pregnant or think they might be pregnant. Men should not father a child while taking this medicine and for at least 2 months after stopping it. There is a potential for serious side effects to an unborn child. Talk to your health care professional or pharmacist for more information. Do not breast-feed an infant while taking this medicine. What side effects may I notice from receiving this medicine? Side effects that you should report to your doctor or health care professional as soon as possible: -low blood counts - this medicine may decrease the number of white blood cells, red blood cells and platelets. You may be at increased risk for infections and bleeding. -signs of infection - fever or chills, cough, sore throat, pain or difficulty passing urine -signs of decreased platelets or bleeding - bruising, pinpoint red spots on the skin, black, tarry stools, blood in the urine -signs of decreased red blood cells - unusual weakness or tiredness, fainting spells, lightheadedness -increased blood sugar Side effects that usually do not require medical attention (report to your prescriber or health care professional if they continue or are bothersome): -constipation -diarrhea -headache -loss of appetite -nausea, vomiting -skin rash, itching -stomach pain -water retention -weak or tired This list may not describe all possible side effects. Call your doctor for medical advice about side effects. You may report side effects to FDA at 1-800-FDA-1088. Where should I keep my medicine? This drug is given in a hospital or clinic and will not be stored at home. NOTE: This sheet is a summary. It may not cover all possible information. If you have questions about this medicine, talk to your doctor, pharmacist, or health care provider.    2016, Elsevier/Gold Standard. (2015-04-17 12:56:02)

## 2016-01-23 ENCOUNTER — Ambulatory Visit (HOSPITAL_BASED_OUTPATIENT_CLINIC_OR_DEPARTMENT_OTHER): Payer: Commercial Managed Care - HMO

## 2016-01-23 VITALS — BP 112/74 | HR 108 | Temp 98.2°F | Resp 18

## 2016-01-23 DIAGNOSIS — D469 Myelodysplastic syndrome, unspecified: Secondary | ICD-10-CM

## 2016-01-23 DIAGNOSIS — D47Z9 Other specified neoplasms of uncertain behavior of lymphoid, hematopoietic and related tissue: Secondary | ICD-10-CM

## 2016-01-23 DIAGNOSIS — E875 Hyperkalemia: Secondary | ICD-10-CM

## 2016-01-23 DIAGNOSIS — Z5111 Encounter for antineoplastic chemotherapy: Secondary | ICD-10-CM | POA: Diagnosis not present

## 2016-01-23 LAB — BASIC METABOLIC PANEL - CANCER CENTER ONLY
BUN, Bld: 15 mg/dL (ref 7–22)
CHLORIDE: 100 meq/L (ref 98–108)
CO2: 29 mEq/L (ref 18–33)
CREATININE: 0.7 mg/dL (ref 0.6–1.2)
Calcium: 9.4 mg/dL (ref 8.0–10.3)
Glucose, Bld: 198 mg/dL — ABNORMAL HIGH (ref 73–118)
Potassium: 4.2 mEq/L (ref 3.3–4.7)
Sodium: 139 mEq/L (ref 128–145)

## 2016-01-23 MED ORDER — DECITABINE CHEMO INJECTION 50 MG
20.0000 mg/m2 | Freq: Once | INTRAVENOUS | Status: AC
  Administered 2016-01-23: 50 mg via INTRAVENOUS
  Filled 2016-01-23: qty 10

## 2016-01-23 MED ORDER — SODIUM CHLORIDE 0.9 % IV SOLN
Freq: Once | INTRAVENOUS | Status: AC
  Administered 2016-01-23: 11:00:00 via INTRAVENOUS

## 2016-01-23 MED ORDER — PROCHLORPERAZINE MALEATE 10 MG PO TABS
ORAL_TABLET | ORAL | Status: AC
  Filled 2016-01-23: qty 1

## 2016-01-23 MED ORDER — PROCHLORPERAZINE MALEATE 10 MG PO TABS
10.0000 mg | ORAL_TABLET | Freq: Once | ORAL | Status: AC
  Administered 2016-01-23: 10 mg via ORAL

## 2016-01-23 NOTE — Patient Instructions (Signed)
[facility] Discharge Instructions for Patients Receiving Chemotherapy  Today you received the following chemotherapy agents Dacogen.  To help prevent nausea and vomiting after your treatment, we encourage you to take your nausea medication.   If you develop nausea and vomiting that is not controlled by your nausea medication, call the clinic.   BELOW ARE SYMPTOMS THAT SHOULD BE REPORTED IMMEDIATELY:  *FEVER GREATER THAN 100.5 F  *CHILLS WITH OR WITHOUT FEVER  NAUSEA AND VOMITING THAT IS NOT CONTROLLED WITH YOUR NAUSEA MEDICATION  *UNUSUAL SHORTNESS OF BREATH  *UNUSUAL BRUISING OR BLEEDING  TENDERNESS IN MOUTH AND THROAT WITH OR WITHOUT PRESENCE OF ULCERS  *URINARY PROBLEMS  *BOWEL PROBLEMS  UNUSUAL RASH Items with * indicate a potential emergency and should be followed up as soon as possible.  Feel free to call the clinic you have any questions or concerns. The clinic phone number is [phone].  Please show the CHEMO ALERT CARD at check-in to the Emergency Department and triage nurse.  

## 2016-01-24 ENCOUNTER — Other Ambulatory Visit: Payer: Self-pay | Admitting: *Deleted

## 2016-01-24 ENCOUNTER — Ambulatory Visit (HOSPITAL_BASED_OUTPATIENT_CLINIC_OR_DEPARTMENT_OTHER): Payer: Commercial Managed Care - HMO

## 2016-01-24 VITALS — BP 111/66 | HR 101 | Temp 98.4°F | Resp 20

## 2016-01-24 DIAGNOSIS — D469 Myelodysplastic syndrome, unspecified: Secondary | ICD-10-CM | POA: Diagnosis not present

## 2016-01-24 DIAGNOSIS — Z5111 Encounter for antineoplastic chemotherapy: Secondary | ICD-10-CM | POA: Diagnosis not present

## 2016-01-24 DIAGNOSIS — D47Z9 Other specified neoplasms of uncertain behavior of lymphoid, hematopoietic and related tissue: Secondary | ICD-10-CM | POA: Diagnosis not present

## 2016-01-24 DIAGNOSIS — D473 Essential (hemorrhagic) thrombocythemia: Secondary | ICD-10-CM

## 2016-01-24 DIAGNOSIS — D75839 Thrombocytosis, unspecified: Secondary | ICD-10-CM

## 2016-01-24 MED ORDER — PROCHLORPERAZINE MALEATE 10 MG PO TABS
10.0000 mg | ORAL_TABLET | Freq: Once | ORAL | Status: AC
  Administered 2016-01-24: 10 mg via ORAL

## 2016-01-24 MED ORDER — SODIUM CHLORIDE 0.9 % IV SOLN
Freq: Once | INTRAVENOUS | Status: AC
  Administered 2016-01-24: 12:00:00 via INTRAVENOUS

## 2016-01-24 MED ORDER — PROCHLORPERAZINE MALEATE 10 MG PO TABS
ORAL_TABLET | ORAL | Status: AC
  Filled 2016-01-24: qty 1

## 2016-01-24 MED ORDER — SODIUM CHLORIDE 0.9 % IV SOLN
20.0000 mg/m2 | Freq: Once | INTRAVENOUS | Status: AC
  Administered 2016-01-24: 50 mg via INTRAVENOUS
  Filled 2016-01-24: qty 10

## 2016-01-24 NOTE — Patient Instructions (Signed)
[facility] Discharge Instructions for Patients Receiving Chemotherapy  Today you received the following chemotherapy agents Dacogen.  To help prevent nausea and vomiting after your treatment, we encourage you to take your nausea medication.   If you develop nausea and vomiting that is not controlled by your nausea medication, call the clinic.   BELOW ARE SYMPTOMS THAT SHOULD BE REPORTED IMMEDIATELY:  *FEVER GREATER THAN 100.5 F  *CHILLS WITH OR WITHOUT FEVER  NAUSEA AND VOMITING THAT IS NOT CONTROLLED WITH YOUR NAUSEA MEDICATION  *UNUSUAL SHORTNESS OF BREATH  *UNUSUAL BRUISING OR BLEEDING  TENDERNESS IN MOUTH AND THROAT WITH OR WITHOUT PRESENCE OF ULCERS  *URINARY PROBLEMS  *BOWEL PROBLEMS  UNUSUAL RASH Items with * indicate a potential emergency and should be followed up as soon as possible.  Feel free to call the clinic you have any questions or concerns. The clinic phone number is [phone].  Please show the CHEMO ALERT CARD at check-in to the Emergency Department and triage nurse.  

## 2016-01-25 ENCOUNTER — Other Ambulatory Visit (HOSPITAL_BASED_OUTPATIENT_CLINIC_OR_DEPARTMENT_OTHER): Payer: Commercial Managed Care - HMO

## 2016-01-25 ENCOUNTER — Ambulatory Visit (HOSPITAL_BASED_OUTPATIENT_CLINIC_OR_DEPARTMENT_OTHER): Payer: Commercial Managed Care - HMO

## 2016-01-25 ENCOUNTER — Other Ambulatory Visit: Payer: Commercial Managed Care - HMO

## 2016-01-25 VITALS — BP 122/63 | HR 72 | Temp 98.1°F | Resp 18

## 2016-01-25 DIAGNOSIS — D469 Myelodysplastic syndrome, unspecified: Secondary | ICD-10-CM

## 2016-01-25 DIAGNOSIS — C469 Kaposi's sarcoma, unspecified: Secondary | ICD-10-CM

## 2016-01-25 DIAGNOSIS — D473 Essential (hemorrhagic) thrombocythemia: Secondary | ICD-10-CM

## 2016-01-25 DIAGNOSIS — Z5111 Encounter for antineoplastic chemotherapy: Secondary | ICD-10-CM

## 2016-01-25 DIAGNOSIS — D47Z9 Other specified neoplasms of uncertain behavior of lymphoid, hematopoietic and related tissue: Secondary | ICD-10-CM | POA: Diagnosis not present

## 2016-01-25 DIAGNOSIS — D75839 Thrombocytosis, unspecified: Secondary | ICD-10-CM

## 2016-01-25 LAB — CBC WITH DIFFERENTIAL (CANCER CENTER ONLY)
BASO#: 0 10*3/uL (ref 0.0–0.2)
BASO%: 0.5 % (ref 0.0–2.0)
EOS ABS: 0 10*3/uL (ref 0.0–0.5)
EOS%: 0.2 % (ref 0.0–7.0)
HEMATOCRIT: 43.4 % (ref 38.7–49.9)
HGB: 14.8 g/dL (ref 13.0–17.1)
LYMPH#: 1.3 10*3/uL (ref 0.9–3.3)
LYMPH%: 22.9 % (ref 14.0–48.0)
MCH: 36 pg — AB (ref 28.0–33.4)
MCHC: 34.1 g/dL (ref 32.0–35.9)
MCV: 106 fL — AB (ref 82–98)
MONO#: 1.3 10*3/uL — AB (ref 0.1–0.9)
MONO%: 23.3 % — ABNORMAL HIGH (ref 0.0–13.0)
NEUT#: 2.9 10*3/uL (ref 1.5–6.5)
NEUT%: 53.1 % (ref 40.0–80.0)
RBC: 4.11 10*6/uL — AB (ref 4.20–5.70)
RDW: 22 % — AB (ref 11.1–15.7)
WBC: 5.5 10*3/uL (ref 4.0–10.0)

## 2016-01-25 MED ORDER — PROCHLORPERAZINE MALEATE 10 MG PO TABS
10.0000 mg | ORAL_TABLET | Freq: Once | ORAL | Status: AC
  Administered 2016-01-25: 10 mg via ORAL

## 2016-01-25 MED ORDER — PROCHLORPERAZINE MALEATE 10 MG PO TABS
ORAL_TABLET | ORAL | Status: AC
  Filled 2016-01-25: qty 1

## 2016-01-25 MED ORDER — SODIUM CHLORIDE 0.9 % IV SOLN
20.0000 mg/m2 | Freq: Once | INTRAVENOUS | Status: AC
  Administered 2016-01-25: 50 mg via INTRAVENOUS
  Filled 2016-01-25: qty 10

## 2016-01-25 MED ORDER — SODIUM CHLORIDE 0.9 % IV SOLN
Freq: Once | INTRAVENOUS | Status: AC
  Administered 2016-01-25: 11:00:00 via INTRAVENOUS

## 2016-01-25 NOTE — Patient Instructions (Signed)
Decitabine injection for infusion What is this medicine? DECITABINE (dee SYE ta been) is a chemotherapy drug. This medicine reduces the growth of cancer cells. It is used to treat adults with myelodysplastic syndromes. This medicine may be used for other purposes; ask your health care provider or pharmacist if you have questions. What should I tell my health care provider before I take this medicine? They need to know if you have any of these conditions: -infection (especially a virus infection such as chickenpox, cold sores, or herpes) -kidney disease -liver disease -an unusual or allergic reaction to decitabine, other medicines, foods, dyes, or preservatives -pregnant or trying to get pregnant -breast-feeding How should I use this medicine? This medicine is for infusion into a vein. It is administered in a hospital or clinic by a doctor or health care professional. Talk to your pediatrician regarding the use of this medicine in children. Special care may be needed. Overdosage: If you think you have taken too much of this medicine contact a poison control center or emergency room at once. NOTE: This medicine is only for you. Do not share this medicine with others. What if I miss a dose? It is important not to miss your dose. Call your doctor or health care professional if you are unable to keep an appointment. What may interact with this medicine? -vaccines Talk to your doctor or health care professional before taking any of these medicines: -aspirin -acetaminophen -ibuprofen -ketoprofen -naproxen This list may not describe all possible interactions. Give your health care provider a list of all the medicines, herbs, non-prescription drugs, or dietary supplements you use. Also tell them if you smoke, drink alcohol, or use illegal drugs. Some items may interact with your medicine. What should I watch for while using this medicine? Visit your doctor for checks on your progress. This drug  may make you feel generally unwell. This is not uncommon, as chemotherapy can affect healthy cells as well as cancer cells. Report any side effects. Continue your course of treatment even though you feel ill unless your doctor tells you to stop. In some cases, you may be given additional medicines to help with side effects. Follow all directions for their use. Call your doctor or health care professional for advice if you get a fever, chills or sore throat, or other symptoms of a cold or flu. Do not treat yourself. This drug decreases your body's ability to fight infections. Try to avoid being around people who are sick. This medicine may increase your risk to bruise or bleed. Call your doctor or health care professional if you notice any unusual bleeding. Do not become pregnant while taking this medicine or for at least 1 month after stopping it. Women should inform their doctor if they wish to become pregnant or think they might be pregnant. Men should not father a child while taking this medicine and for at least 2 months after stopping it. There is a potential for serious side effects to an unborn child. Talk to your health care professional or pharmacist for more information. Do not breast-feed an infant while taking this medicine. What side effects may I notice from receiving this medicine? Side effects that you should report to your doctor or health care professional as soon as possible: -low blood counts - this medicine may decrease the number of white blood cells, red blood cells and platelets. You may be at increased risk for infections and bleeding. -signs of infection - fever or chills, cough, sore throat,   pain or difficulty passing urine -signs of decreased platelets or bleeding - bruising, pinpoint red spots on the skin, black, tarry stools, blood in the urine -signs of decreased red blood cells - unusual weakness or tiredness, fainting spells, lightheadedness -increased blood sugar Side  effects that usually do not require medical attention (report to your prescriber or health care professional if they continue or are bothersome): -constipation -diarrhea -headache -loss of appetite -nausea, vomiting -skin rash, itching -stomach pain -water retention -weak or tired This list may not describe all possible side effects. Call your doctor for medical advice about side effects. You may report side effects to FDA at 1-800-FDA-1088. Where should I keep my medicine? This drug is given in a hospital or clinic and will not be stored at home. NOTE: This sheet is a summary. It may not cover all possible information. If you have questions about this medicine, talk to your doctor, pharmacist, or health care provider.    2016, Elsevier/Gold Standard. (2015-04-17 12:56:02)  

## 2016-01-28 ENCOUNTER — Other Ambulatory Visit: Payer: Commercial Managed Care - HMO

## 2016-01-29 ENCOUNTER — Other Ambulatory Visit (HOSPITAL_BASED_OUTPATIENT_CLINIC_OR_DEPARTMENT_OTHER): Payer: Commercial Managed Care - HMO

## 2016-01-29 DIAGNOSIS — D469 Myelodysplastic syndrome, unspecified: Secondary | ICD-10-CM | POA: Diagnosis not present

## 2016-01-29 DIAGNOSIS — D47Z9 Other specified neoplasms of uncertain behavior of lymphoid, hematopoietic and related tissue: Secondary | ICD-10-CM

## 2016-01-29 LAB — COMPREHENSIVE METABOLIC PANEL
ALBUMIN: 3.9 g/dL (ref 3.5–5.0)
ALK PHOS: 53 U/L (ref 40–150)
ALT: 15 U/L (ref 0–55)
ANION GAP: 7 meq/L (ref 3–11)
AST: 30 U/L (ref 5–34)
BILIRUBIN TOTAL: 0.47 mg/dL (ref 0.20–1.20)
BUN: 18.1 mg/dL (ref 7.0–26.0)
CALCIUM: 9.5 mg/dL (ref 8.4–10.4)
CO2: 28 mEq/L (ref 22–29)
Chloride: 102 mEq/L (ref 98–109)
Creatinine: 1 mg/dL (ref 0.7–1.3)
GLUCOSE: 145 mg/dL — AB (ref 70–140)
POTASSIUM: 4.6 meq/L (ref 3.5–5.1)
SODIUM: 137 meq/L (ref 136–145)
TOTAL PROTEIN: 6.6 g/dL (ref 6.4–8.3)

## 2016-01-29 LAB — CBC WITH DIFFERENTIAL (CANCER CENTER ONLY)
BASO#: 0 10*3/uL (ref 0.0–0.2)
BASO%: 0.4 % (ref 0.0–2.0)
EOS%: 0.1 % (ref 0.0–7.0)
Eosinophils Absolute: 0 10*3/uL (ref 0.0–0.5)
HEMATOCRIT: 40.5 % (ref 38.7–49.9)
HEMOGLOBIN: 13.9 g/dL (ref 13.0–17.1)
LYMPH#: 1.2 10*3/uL (ref 0.9–3.3)
LYMPH%: 16.4 % (ref 14.0–48.0)
MCH: 36.5 pg — ABNORMAL HIGH (ref 28.0–33.4)
MCHC: 34.3 g/dL (ref 32.0–35.9)
MCV: 106 fL — AB (ref 82–98)
MONO#: 1 10*3/uL — AB (ref 0.1–0.9)
MONO%: 12.8 % (ref 0.0–13.0)
NEUT#: 5.3 10*3/uL (ref 1.5–6.5)
NEUT%: 70.3 % (ref 40.0–80.0)
Platelets: 1259 10*3/uL — ABNORMAL HIGH (ref 145–400)
RBC: 3.81 10*6/uL — ABNORMAL LOW (ref 4.20–5.70)
RDW: 21.5 % — ABNORMAL HIGH (ref 11.1–15.7)
WBC: 7.6 10*3/uL (ref 4.0–10.0)

## 2016-02-01 ENCOUNTER — Other Ambulatory Visit: Payer: Commercial Managed Care - HMO

## 2016-02-04 ENCOUNTER — Other Ambulatory Visit (HOSPITAL_BASED_OUTPATIENT_CLINIC_OR_DEPARTMENT_OTHER): Payer: Commercial Managed Care - HMO

## 2016-02-04 DIAGNOSIS — D47Z9 Other specified neoplasms of uncertain behavior of lymphoid, hematopoietic and related tissue: Secondary | ICD-10-CM | POA: Diagnosis not present

## 2016-02-04 DIAGNOSIS — D469 Myelodysplastic syndrome, unspecified: Secondary | ICD-10-CM

## 2016-02-04 LAB — COMPREHENSIVE METABOLIC PANEL (CC13)
A/G RATIO: 1.7 (ref 1.2–2.2)
ALK PHOS: 53 IU/L (ref 39–117)
ALT: 12 IU/L (ref 0–44)
AST: 13 IU/L (ref 0–40)
Albumin, Serum: 3.9 g/dL (ref 3.5–5.5)
BUN/Creatinine Ratio: 13 (ref 9–20)
BUN: 13 mg/dL (ref 6–24)
Bilirubin Total: 0.3 mg/dL (ref 0.0–1.2)
CALCIUM: 9 mg/dL (ref 8.7–10.2)
Carbon Dioxide, Total: 26 mmol/L (ref 18–29)
Chloride, Ser: 101 mmol/L (ref 96–106)
Creatinine, Ser: 1.03 mg/dL (ref 0.76–1.27)
GFR calc Af Amer: 100 mL/min/{1.73_m2} (ref >59)
GFR, EST NON AFRICAN AMERICAN: 87 mL/min/{1.73_m2} (ref >59)
GLOBULIN, TOTAL: 2.3 g/dL (ref 1.5–4.5)
Glucose: 218 mg/dL — ABNORMAL HIGH (ref 65–99)
POTASSIUM: 4.2 mmol/L (ref 3.5–5.2)
SODIUM: 134 mmol/L (ref 134–144)
Total Protein: 6.2 g/dL (ref 6.0–8.5)

## 2016-02-04 LAB — CBC WITH DIFFERENTIAL (CANCER CENTER ONLY)
BASO#: 0 10*3/uL (ref 0.0–0.2)
BASO%: 0.7 % (ref 0.0–2.0)
EOS%: 0.9 % (ref 0.0–7.0)
Eosinophils Absolute: 0.1 10*3/uL (ref 0.0–0.5)
HEMATOCRIT: 37.5 % — AB (ref 38.7–49.9)
HGB: 13.1 g/dL (ref 13.0–17.1)
LYMPH#: 1.1 10*3/uL (ref 0.9–3.3)
LYMPH%: 18.9 % (ref 14.0–48.0)
MCH: 36.7 pg — AB (ref 28.0–33.4)
MCHC: 34.9 g/dL (ref 32.0–35.9)
MCV: 105 fL — AB (ref 82–98)
MONO#: 0.9 10*3/uL (ref 0.1–0.9)
MONO%: 16.2 % — ABNORMAL HIGH (ref 0.0–13.0)
NEUT#: 3.6 10*3/uL (ref 1.5–6.5)
NEUT%: 63.3 % (ref 40.0–80.0)
Platelets: 1033 10*3/uL — ABNORMAL HIGH (ref 145–400)
RBC: 3.57 10*6/uL — ABNORMAL LOW (ref 4.20–5.70)
RDW: 20.1 % — AB (ref 11.1–15.7)
WBC: 5.6 10*3/uL (ref 4.0–10.0)

## 2016-02-04 LAB — TECHNOLOGIST REVIEW CHCC SATELLITE

## 2016-02-08 ENCOUNTER — Other Ambulatory Visit: Payer: Commercial Managed Care - HMO

## 2016-02-11 ENCOUNTER — Other Ambulatory Visit: Payer: Commercial Managed Care - HMO

## 2016-02-15 ENCOUNTER — Other Ambulatory Visit: Payer: Commercial Managed Care - HMO

## 2016-02-18 ENCOUNTER — Ambulatory Visit (HOSPITAL_BASED_OUTPATIENT_CLINIC_OR_DEPARTMENT_OTHER): Payer: Commercial Managed Care - HMO | Admitting: Family

## 2016-02-18 ENCOUNTER — Ambulatory Visit (HOSPITAL_BASED_OUTPATIENT_CLINIC_OR_DEPARTMENT_OTHER): Payer: Commercial Managed Care - HMO

## 2016-02-18 ENCOUNTER — Other Ambulatory Visit (HOSPITAL_BASED_OUTPATIENT_CLINIC_OR_DEPARTMENT_OTHER): Payer: Commercial Managed Care - HMO

## 2016-02-18 ENCOUNTER — Other Ambulatory Visit: Payer: Self-pay | Admitting: Hematology & Oncology

## 2016-02-18 ENCOUNTER — Encounter: Payer: Self-pay | Admitting: Family

## 2016-02-18 VITALS — BP 139/77 | HR 80 | Temp 97.6°F | Resp 16 | Ht 73.0 in | Wt 275.0 lb

## 2016-02-18 DIAGNOSIS — D75839 Thrombocytosis, unspecified: Secondary | ICD-10-CM

## 2016-02-18 DIAGNOSIS — D473 Essential (hemorrhagic) thrombocythemia: Secondary | ICD-10-CM

## 2016-02-18 DIAGNOSIS — D469 Myelodysplastic syndrome, unspecified: Secondary | ICD-10-CM

## 2016-02-18 DIAGNOSIS — D47Z9 Other specified neoplasms of uncertain behavior of lymphoid, hematopoietic and related tissue: Secondary | ICD-10-CM

## 2016-02-18 DIAGNOSIS — Z5111 Encounter for antineoplastic chemotherapy: Secondary | ICD-10-CM

## 2016-02-18 LAB — CMP (CANCER CENTER ONLY)
ALT: 23 U/L (ref 10–47)
AST: 22 U/L (ref 11–38)
Albumin: 3.6 g/dL (ref 3.3–5.5)
Alkaline Phosphatase: 51 U/L (ref 26–84)
BUN: 15 mg/dL (ref 7–22)
CHLORIDE: 102 meq/L (ref 98–108)
CO2: 27 meq/L (ref 18–33)
Calcium: 9.3 mg/dL (ref 8.0–10.3)
Creat: 0.8 mg/dl (ref 0.6–1.2)
GLUCOSE: 76 mg/dL (ref 73–118)
POTASSIUM: 4 meq/L (ref 3.3–4.7)
Sodium: 140 mEq/L (ref 128–145)
Total Bilirubin: 0.7 mg/dl (ref 0.20–1.60)
Total Protein: 6.5 g/dL (ref 6.4–8.1)

## 2016-02-18 LAB — CBC WITH DIFFERENTIAL (CANCER CENTER ONLY)
BASO#: 0 10*3/uL (ref 0.0–0.2)
BASO%: 0.4 % (ref 0.0–2.0)
EOS%: 0.4 % (ref 0.0–7.0)
Eosinophils Absolute: 0 10*3/uL (ref 0.0–0.5)
HCT: 41.2 % (ref 38.7–49.9)
HGB: 13.9 g/dL (ref 13.0–17.1)
LYMPH#: 1.5 10*3/uL (ref 0.9–3.3)
LYMPH%: 21 % (ref 14.0–48.0)
MCH: 36.4 pg — AB (ref 28.0–33.4)
MCHC: 33.7 g/dL (ref 32.0–35.9)
MCV: 108 fL — AB (ref 82–98)
MONO#: 1 10*3/uL — AB (ref 0.1–0.9)
MONO%: 13.7 % — ABNORMAL HIGH (ref 0.0–13.0)
NEUT#: 4.7 10*3/uL (ref 1.5–6.5)
NEUT%: 64.5 % (ref 40.0–80.0)
RBC: 3.82 10*6/uL — ABNORMAL LOW (ref 4.20–5.70)
RDW: 17.9 % — AB (ref 11.1–15.7)
WBC: 7.3 10*3/uL (ref 4.0–10.0)

## 2016-02-18 MED ORDER — SODIUM CHLORIDE 0.9 % IV SOLN
20.0000 mg/m2 | Freq: Once | INTRAVENOUS | Status: AC
  Administered 2016-02-18: 50 mg via INTRAVENOUS
  Filled 2016-02-18: qty 10

## 2016-02-18 MED ORDER — SODIUM CHLORIDE 0.9 % IV SOLN
Freq: Once | INTRAVENOUS | Status: AC
  Administered 2016-02-18: 11:00:00 via INTRAVENOUS

## 2016-02-18 MED ORDER — PROCHLORPERAZINE MALEATE 10 MG PO TABS
ORAL_TABLET | ORAL | Status: AC
  Filled 2016-02-18: qty 1

## 2016-02-18 MED ORDER — PROCHLORPERAZINE MALEATE 10 MG PO TABS
10.0000 mg | ORAL_TABLET | Freq: Once | ORAL | Status: AC
  Administered 2016-02-18: 10 mg via ORAL

## 2016-02-18 NOTE — Progress Notes (Signed)
Hematology and Oncology Follow Up Visit  Patrick Blake [id number] May 18, 1971 45 y.o. 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 His platelet count is now 2,041. He has had a few episodes of blood on the tissue when he blows his nose. No other episodes of bleeding, bruising or petechiae.  He saw Dr. Joan Mayans with [facility] last Thursday and she has increased his Hydrea to 1,000 mg TID which he started on Friday. He has an appointment with oncology at [facility] on 6/1.  He tolerated his first cycle of Dacogen well.  He has had a few hot flashes where he got hot and sweaty. This lasted for only a few minutes each time and resolved on its own.  No fever, chills, n/v, cough, rash, dizziness, SOB, chest pain, palpitations, abdominal pain or changes in bowel or bladder habits.  No tenderness, numbness or tingling in his extremities. He has some "puffiness" in his ankles that comes and goes. This is not a new issue for him and resolves if he puts his feet up.  His blood sugars have been fairly well controlled. He is eating well and staying hydrated. His weight is stable.   Medications:    Medication List       This list is accurate as of: 02/18/16 10:21 AM.  Always use your most recent med list.               aspirin 81 MG chewable tablet  Chew 81 mg by mouth.     ezetimibe 10 MG tablet  Commonly known as:  ZETIA  Take 10 mg by mouth.     indapamide 1.25 MG tablet  Commonly known as:  LOZOL  Take 1.25 mg by mouth.     insulin aspart 100 UNIT/ML injection  Commonly known as:  novoLOG  Inject into the skin.     MULTI-VITAMIN/IRON PO  Take by mouth.     ondansetron 8 MG tablet  Commonly known as:  ZOFRAN  Take 1 tablet (8 mg total) by mouth every 8 (eight) hours as needed  for nausea or vomiting.     ONE TOUCH ULTRA TEST test strip  Generic drug:  glucose blood  USE AS DIRECTED. TESTING FREQUENCY: 2-4 TIMES DAILY.     pioglitazone 30 MG tablet  Commonly known as:  ACTOS  Take 30 mg by mouth.     prochlorperazine 10 MG tablet  Commonly known as:  COMPAZINE  Take 1 tablet (10 mg total) by mouth every 6 (six) hours as needed (Nausea or vomiting).     ramipril 10 MG capsule  Commonly known as:  ALTACE  Take 10 mg by mouth.     TOUJEO SOLOSTAR 300 UNIT/ML Sopn  Generic drug:  Insulin Glargine  TAKE 40 UNITS BEFORE BREAKFAST DAILY        Allergies: No Known Allergies  Past Medical History, Surgical history, Social history, and Family History were reviewed and updated.  Review of Systems: All other 10 point review of systems is negative.   Physical Exam:  vitals were not taken for this visit.  Wt Readings from Last 3 Encounters:  01/22/16 269 lb 8 oz (122.244 kg)  11/15/15 271 lb (122.925 kg)  10/08/15 275 lb (124.739 kg)  Ocular: Sclerae unicteric, pupils equal, round and reactive to light Ear-nose-throat: Oropharynx clear, dentition fair Lymphatic: No cervical supraclavicular or axillary adenopathy Lungs no rales or rhonchi, good excursion bilaterally Heart regular rate and rhythm, no murmur appreciated Abd soft, nontender, positive bowel sounds, no liver or spleen tip palpated on exam, no fluid wave MSK no focal spinal tenderness, no joint edema Neuro: non-focal, well-oriented, appropriate affect Breasts: Deferred  Lab Results  Component Value Date   WBC 7.3 02/18/2016   HGB 13.9 02/18/2016   HCT 41.2 02/18/2016   MCV 108* 02/18/2016   PLT 2,041* 02/18/2016   Lab Results  Component Value Date   FERRITIN 79 12/28/2015   IRON 89 12/28/2015   TIBC 278 12/28/2015   UIBC 188 12/28/2015   IRONPCTSAT 32 12/28/2015   Lab Results  Component Value Date   RBC 3.82* 02/18/2016   No results found for: KPAFRELGTCHN, LAMBDASER,  KAPLAMBRATIO No results found for: Kandis Cocking, IGMSERUM No results found for: Odetta Pink, SPEI   Chemistry      Component Value Date/Time   NA 134 02/04/2016 1425   NA 137 01/29/2016 1006   NA 139 01/23/2016 1024   NA 139 05/18/2007 1542   K 4.2 02/04/2016 1425   K 4.6 01/29/2016 1006   K 4.2 01/23/2016 1024   K 4.5 05/18/2007 1542   CL 101 02/04/2016 1425   CL 100 01/23/2016 1024   CL 103 05/18/2007 1542   CO2 26 02/04/2016 1425   CO2 28 01/29/2016 1006   CO2 29 01/23/2016 1024   BUN 13 02/04/2016 1425   BUN 18.1 01/29/2016 1006   BUN 15 01/23/2016 1024   BUN 11 05/18/2007 1542   CREATININE 1.03 02/04/2016 1425   CREATININE 1.0 01/29/2016 1006   CREATININE 0.7 01/23/2016 1024      Component Value Date/Time   CALCIUM 9.0 02/04/2016 1425   CALCIUM 9.5 01/29/2016 1006   CALCIUM 9.4 01/23/2016 1024   ALKPHOS 53 02/04/2016 1425   ALKPHOS 53 01/29/2016 1006   ALKPHOS 53 01/21/2016 1300   AST 13 02/04/2016 1425   AST 30 01/29/2016 1006   AST 20 01/21/2016 1300   ALT 12 02/04/2016 1425   ALT 15 01/29/2016 1006   ALT 20 01/21/2016 1300   BILITOT 0.3 02/04/2016 1425   BILITOT 0.47 01/29/2016 1006   BILITOT 0.90 01/21/2016 1300     Impression and Plan: Patrick Blake is a 45 yo white male with a hybrid myelodysplastic/myeloproliferative disorder. He tolerated his first cycle of Dacogen nicely. He is having some fatigue at times.  His platelet count is significantly increased at 2,041 and his Hydrea was increased to 1,000 mg 3 times daily. We will recheck his labs on Thursday.  He has an appointment with [facility] next week on 6/1.  We will proceed with cycle 2 of Dacogen today as planned.  He has his current treatment and appointment schedule.  Both he and his wife know to contact our office with any questions or concerns. We can certainly see her sooner if need be.   Eliezer Bottom, NP 5/22/201710:21 AM

## 2016-02-18 NOTE — Patient Instructions (Signed)
[facility] Discharge Instructions for Patients Receiving Chemotherapy  Today you received the following chemotherapy agents Dacogen.  To help prevent nausea and vomiting after your treatment, we encourage you to take your nausea medication.   If you develop nausea and vomiting that is not controlled by your nausea medication, call the clinic.   BELOW ARE SYMPTOMS THAT SHOULD BE REPORTED IMMEDIATELY:  *FEVER GREATER THAN 100.5 F  *CHILLS WITH OR WITHOUT FEVER  NAUSEA AND VOMITING THAT IS NOT CONTROLLED WITH YOUR NAUSEA MEDICATION  *UNUSUAL SHORTNESS OF BREATH  *UNUSUAL BRUISING OR BLEEDING  TENDERNESS IN MOUTH AND THROAT WITH OR WITHOUT PRESENCE OF ULCERS  *URINARY PROBLEMS  *BOWEL PROBLEMS  UNUSUAL RASH Items with * indicate a potential emergency and should be followed up as soon as possible.  Feel free to call the clinic you have any questions or concerns. The clinic phone number is [phone].  Please show the CHEMO ALERT CARD at check-in to the Emergency Department and triage nurse.  

## 2016-02-19 ENCOUNTER — Ambulatory Visit (HOSPITAL_BASED_OUTPATIENT_CLINIC_OR_DEPARTMENT_OTHER): Payer: Commercial Managed Care - HMO

## 2016-02-19 VITALS — BP 127/70 | HR 108 | Temp 98.0°F | Resp 18

## 2016-02-19 DIAGNOSIS — D469 Myelodysplastic syndrome, unspecified: Secondary | ICD-10-CM | POA: Diagnosis not present

## 2016-02-19 DIAGNOSIS — D47Z9 Other specified neoplasms of uncertain behavior of lymphoid, hematopoietic and related tissue: Secondary | ICD-10-CM | POA: Diagnosis not present

## 2016-02-19 DIAGNOSIS — Z5111 Encounter for antineoplastic chemotherapy: Secondary | ICD-10-CM

## 2016-02-19 MED ORDER — SODIUM CHLORIDE 0.9 % IV SOLN
Freq: Once | INTRAVENOUS | Status: AC
  Administered 2016-02-19: 10:00:00 via INTRAVENOUS

## 2016-02-19 MED ORDER — PROCHLORPERAZINE MALEATE 10 MG PO TABS
10.0000 mg | ORAL_TABLET | Freq: Once | ORAL | Status: AC
  Administered 2016-02-19: 10 mg via ORAL

## 2016-02-19 MED ORDER — PROCHLORPERAZINE MALEATE 10 MG PO TABS
ORAL_TABLET | ORAL | Status: AC
  Filled 2016-02-19: qty 1

## 2016-02-19 MED ORDER — SODIUM CHLORIDE 0.9 % IV SOLN
20.0000 mg/m2 | Freq: Once | INTRAVENOUS | Status: AC
  Administered 2016-02-19: 50 mg via INTRAVENOUS
  Filled 2016-02-19: qty 10

## 2016-02-19 NOTE — Patient Instructions (Signed)
[facility] Discharge Instructions for Patients Receiving Chemotherapy  Today you received the following agents DACOGEN   To help prevent nausea and vomiting after your treatment, we encourage you to take your nausea medication AS PRESCRIBED.   If you develop nausea and vomiting that is not controlled by your nausea medication, call the clinic.   BELOW ARE SYMPTOMS THAT SHOULD BE REPORTED IMMEDIATELY:  *FEVER GREATER THAN 100.5 F  *CHILLS WITH OR WITHOUT FEVER  NAUSEA AND VOMITING THAT IS NOT CONTROLLED WITH YOUR NAUSEA MEDICATION  *UNUSUAL SHORTNESS OF BREATH  *UNUSUAL BRUISING OR BLEEDING  TENDERNESS IN MOUTH AND THROAT WITH OR WITHOUT PRESENCE OF ULCERS  *URINARY PROBLEMS  *BOWEL PROBLEMS  UNUSUAL RASH Items with * indicate a potential emergency and should be followed up as soon as possible.  Feel free to call the clinic you have any questions or concerns. The clinic phone number is [phone].  Please show the [place] at check-in to the Emergency Department and triage nurse.

## 2016-02-20 ENCOUNTER — Ambulatory Visit (HOSPITAL_BASED_OUTPATIENT_CLINIC_OR_DEPARTMENT_OTHER): Payer: Commercial Managed Care - HMO

## 2016-02-20 VITALS — BP 120/74 | HR 65 | Temp 98.1°F | Resp 18

## 2016-02-20 DIAGNOSIS — Z5111 Encounter for antineoplastic chemotherapy: Secondary | ICD-10-CM | POA: Diagnosis not present

## 2016-02-20 DIAGNOSIS — D469 Myelodysplastic syndrome, unspecified: Secondary | ICD-10-CM

## 2016-02-20 DIAGNOSIS — D47Z9 Other specified neoplasms of uncertain behavior of lymphoid, hematopoietic and related tissue: Secondary | ICD-10-CM | POA: Diagnosis not present

## 2016-02-20 MED ORDER — SODIUM CHLORIDE 0.9 % IV SOLN
Freq: Once | INTRAVENOUS | Status: AC
  Administered 2016-02-20: 10:00:00 via INTRAVENOUS

## 2016-02-20 MED ORDER — SODIUM CHLORIDE 0.9 % IV SOLN
20.0000 mg/m2 | Freq: Once | INTRAVENOUS | Status: AC
  Administered 2016-02-20: 50 mg via INTRAVENOUS
  Filled 2016-02-20: qty 10

## 2016-02-20 MED ORDER — PROCHLORPERAZINE MALEATE 10 MG PO TABS
ORAL_TABLET | ORAL | Status: AC
  Filled 2016-02-20: qty 1

## 2016-02-20 MED ORDER — PROCHLORPERAZINE MALEATE 10 MG PO TABS
10.0000 mg | ORAL_TABLET | Freq: Once | ORAL | Status: AC
  Administered 2016-02-20: 10 mg via ORAL

## 2016-02-20 NOTE — Patient Instructions (Signed)
Decitabine injection for infusion What is this medicine? DECITABINE (dee SYE ta been) is a chemotherapy drug. This medicine reduces the growth of cancer cells. It is used to treat adults with myelodysplastic syndromes. This medicine may be used for other purposes; ask your health care provider or pharmacist if you have questions. What should I tell my health care provider before I take this medicine? They need to know if you have any of these conditions: -infection (especially a virus infection such as chickenpox, cold sores, or herpes) -kidney disease -liver disease -an unusual or allergic reaction to decitabine, other medicines, foods, dyes, or preservatives -pregnant or trying to get pregnant -breast-feeding How should I use this medicine? This medicine is for infusion into a vein. It is administered in a hospital or clinic by a doctor or health care professional. Talk to your pediatrician regarding the use of this medicine in children. Special care may be needed. Overdosage: If you think you have taken too much of this medicine contact a poison control center or emergency room at once. NOTE: This medicine is only for you. Do not share this medicine with others. What if I miss a dose? It is important not to miss your dose. Call your doctor or health care professional if you are unable to keep an appointment. What may interact with this medicine? -vaccines Talk to your doctor or health care professional before taking any of these medicines: -aspirin -acetaminophen -ibuprofen -ketoprofen -naproxen This list may not describe all possible interactions. Give your health care provider a list of all the medicines, herbs, non-prescription drugs, or dietary supplements you use. Also tell them if you smoke, drink alcohol, or use illegal drugs. Some items may interact with your medicine. What should I watch for while using this medicine? Visit your doctor for checks on your progress. This drug  may make you feel generally unwell. This is not uncommon, as chemotherapy can affect healthy cells as well as cancer cells. Report any side effects. Continue your course of treatment even though you feel ill unless your doctor tells you to stop. In some cases, you may be given additional medicines to help with side effects. Follow all directions for their use. Call your doctor or health care professional for advice if you get a fever, chills or sore throat, or other symptoms of a cold or flu. Do not treat yourself. This drug decreases your body's ability to fight infections. Try to avoid being around people who are sick. This medicine may increase your risk to bruise or bleed. Call your doctor or health care professional if you notice any unusual bleeding. Do not become pregnant while taking this medicine or for at least 1 month after stopping it. Women should inform their doctor if they wish to become pregnant or think they might be pregnant. Men should not father a child while taking this medicine and for at least 2 months after stopping it. There is a potential for serious side effects to an unborn child. Talk to your health care professional or pharmacist for more information. Do not breast-feed an infant while taking this medicine. What side effects may I notice from receiving this medicine? Side effects that you should report to your doctor or health care professional as soon as possible: -low blood counts - this medicine may decrease the number of white blood cells, red blood cells and platelets. You may be at increased risk for infections and bleeding. -signs of infection - fever or chills, cough, sore throat,  pain or difficulty passing urine -signs of decreased platelets or bleeding - bruising, pinpoint red spots on the skin, black, tarry stools, blood in the urine -signs of decreased red blood cells - unusual weakness or tiredness, fainting spells, lightheadedness -increased blood sugar Side  effects that usually do not require medical attention (report to your prescriber or health care professional if they continue or are bothersome): -constipation -diarrhea -headache -loss of appetite -nausea, vomiting -skin rash, itching -stomach pain -water retention -weak or tired This list may not describe all possible side effects. Call your doctor for medical advice about side effects. You may report side effects to FDA at 1-800-FDA-1088. Where should I keep my medicine? This drug is given in a hospital or clinic and will not be stored at home. NOTE: This sheet is a summary. It may not cover all possible information. If you have questions about this medicine, talk to your doctor, pharmacist, or health care provider.    2016, [employer]vier/[employer] Standard. (2015-04-17 12:56:02)

## 2016-02-21 ENCOUNTER — Ambulatory Visit (HOSPITAL_COMMUNITY)
Admission: RE | Admit: 2016-02-21 | Discharge: 2016-02-21 | Disposition: A | Discharge status: Home / Self Care | Payer: Commercial Managed Care - HMO | Source: Ambulatory Visit | Attending: Hematology & Oncology | Admitting: Hematology & Oncology

## 2016-02-21 ENCOUNTER — Other Ambulatory Visit: Payer: Self-pay | Admitting: Family

## 2016-02-21 ENCOUNTER — Ambulatory Visit (HOSPITAL_BASED_OUTPATIENT_CLINIC_OR_DEPARTMENT_OTHER): Payer: Commercial Managed Care - HMO

## 2016-02-21 ENCOUNTER — Other Ambulatory Visit (HOSPITAL_BASED_OUTPATIENT_CLINIC_OR_DEPARTMENT_OTHER): Payer: Commercial Managed Care - HMO

## 2016-02-21 VITALS — BP 106/78 | HR 87 | Temp 97.7°F | Resp 18

## 2016-02-21 DIAGNOSIS — D473 Essential (hemorrhagic) thrombocythemia: Secondary | ICD-10-CM

## 2016-02-21 DIAGNOSIS — D75839 Thrombocytosis, unspecified: Secondary | ICD-10-CM

## 2016-02-21 DIAGNOSIS — D47Z9 Other specified neoplasms of uncertain behavior of lymphoid, hematopoietic and related tissue: Secondary | ICD-10-CM | POA: Diagnosis not present

## 2016-02-21 DIAGNOSIS — D469 Myelodysplastic syndrome, unspecified: Secondary | ICD-10-CM | POA: Diagnosis not present

## 2016-02-21 DIAGNOSIS — D479 Neoplasm of uncertain behavior of lymphoid, hematopoietic and related tissue, unspecified: Secondary | ICD-10-CM | POA: Insufficient documentation

## 2016-02-21 DIAGNOSIS — Z5111 Encounter for antineoplastic chemotherapy: Secondary | ICD-10-CM | POA: Diagnosis not present

## 2016-02-21 DIAGNOSIS — C946 Myelodysplastic disease, not classified: Secondary | ICD-10-CM

## 2016-02-21 LAB — CMP (CANCER CENTER ONLY)
ALBUMIN: 3.5 g/dL (ref 3.3–5.5)
ALT(SGPT): 23 U/L (ref 10–47)
AST: 25 U/L (ref 11–38)
Alkaline Phosphatase: 60 U/L (ref 26–84)
BILIRUBIN TOTAL: 0.8 mg/dL (ref 0.20–1.60)
BUN, Bld: 14 mg/dL (ref 7–22)
CHLORIDE: 103 meq/L (ref 98–108)
CO2: 26 meq/L (ref 18–33)
Calcium: 9.1 mg/dL (ref 8.0–10.3)
Creat: 0.8 mg/dl (ref 0.6–1.2)
Glucose, Bld: 154 mg/dL — ABNORMAL HIGH (ref 73–118)
Potassium: 4.2 mEq/L (ref 3.3–4.7)
SODIUM: 139 meq/L (ref 128–145)
Total Protein: 6.5 g/dL (ref 6.4–8.1)

## 2016-02-21 LAB — CBC WITH DIFFERENTIAL (CANCER CENTER ONLY)
BASO#: 0 10*3/uL (ref 0.0–0.2)
BASO%: 1 % (ref 0.0–2.0)
EOS ABS: 0 10*3/uL (ref 0.0–0.5)
EOS%: 0.2 % (ref 0.0–7.0)
HCT: 41.5 % (ref 38.7–49.9)
HGB: 14.1 g/dL (ref 13.0–17.1)
LYMPH#: 0.9 10*3/uL (ref 0.9–3.3)
LYMPH%: 23.3 % (ref 14.0–48.0)
MCH: 36.7 pg — AB (ref 28.0–33.4)
MCHC: 34 g/dL (ref 32.0–35.9)
MCV: 108 fL — ABNORMAL HIGH (ref 82–98)
MONO#: 0.6 10*3/uL (ref 0.1–0.9)
MONO%: 14.6 % — AB (ref 0.0–13.0)
NEUT%: 60.9 % (ref 40.0–80.0)
NEUTROS ABS: 2.5 10*3/uL (ref 1.5–6.5)
RBC: 3.84 10*6/uL — ABNORMAL LOW (ref 4.20–5.70)
RDW: 17.9 % — AB (ref 11.1–15.7)
WBC: 4 10*3/uL (ref 4.0–10.0)

## 2016-02-21 LAB — ABO/RH: ABO/RH(D): A POS

## 2016-02-21 MED ORDER — SODIUM CHLORIDE 0.9 % IV SOLN
20.0000 mg/m2 | Freq: Once | INTRAVENOUS | Status: AC
  Administered 2016-02-21: 50 mg via INTRAVENOUS
  Filled 2016-02-21: qty 10

## 2016-02-21 MED ORDER — PROCHLORPERAZINE MALEATE 10 MG PO TABS
ORAL_TABLET | ORAL | Status: AC
  Filled 2016-02-21: qty 1

## 2016-02-21 MED ORDER — PROCHLORPERAZINE MALEATE 10 MG PO TABS
10.0000 mg | ORAL_TABLET | Freq: Once | ORAL | Status: AC
  Administered 2016-02-21: 10 mg via ORAL

## 2016-02-21 MED ORDER — SODIUM CHLORIDE 0.9 % IV SOLN
Freq: Once | INTRAVENOUS | Status: AC
  Administered 2016-02-21: 10:00:00 via INTRAVENOUS

## 2016-02-21 NOTE — Patient Instructions (Signed)
[facility] Discharge Instructions for Patients Receiving Chemotherapy  Today you received the following chemotherapy agents Dacogen.  To help prevent nausea and vomiting after your treatment, we encourage you to take your nausea medication.   If you develop nausea and vomiting that is not controlled by your nausea medication, call the clinic.   BELOW ARE SYMPTOMS THAT SHOULD BE REPORTED IMMEDIATELY:  *FEVER GREATER THAN 100.5 F  *CHILLS WITH OR WITHOUT FEVER  NAUSEA AND VOMITING THAT IS NOT CONTROLLED WITH YOUR NAUSEA MEDICATION  *UNUSUAL SHORTNESS OF BREATH  *UNUSUAL BRUISING OR BLEEDING  TENDERNESS IN MOUTH AND THROAT WITH OR WITHOUT PRESENCE OF ULCERS  *URINARY PROBLEMS  *BOWEL PROBLEMS  UNUSUAL RASH Items with * indicate a potential emergency and should be followed up as soon as possible.  Feel free to call the clinic you have any questions or concerns. The clinic phone number is [phone].  Please show the CHEMO ALERT CARD at check-in to the Emergency Department and triage nurse.  

## 2016-02-22 ENCOUNTER — Ambulatory Visit (HOSPITAL_BASED_OUTPATIENT_CLINIC_OR_DEPARTMENT_OTHER): Payer: Commercial Managed Care - HMO

## 2016-02-22 ENCOUNTER — Other Ambulatory Visit: Payer: Commercial Managed Care - HMO

## 2016-02-22 VITALS — BP 116/74 | HR 97 | Temp 98.2°F | Resp 18

## 2016-02-22 DIAGNOSIS — D469 Myelodysplastic syndrome, unspecified: Secondary | ICD-10-CM

## 2016-02-22 DIAGNOSIS — Z5111 Encounter for antineoplastic chemotherapy: Secondary | ICD-10-CM

## 2016-02-22 DIAGNOSIS — D47Z9 Other specified neoplasms of uncertain behavior of lymphoid, hematopoietic and related tissue: Secondary | ICD-10-CM

## 2016-02-22 MED ORDER — SODIUM CHLORIDE 0.9 % IV SOLN
20.0000 mg/m2 | Freq: Once | INTRAVENOUS | Status: AC
  Administered 2016-02-22: 50 mg via INTRAVENOUS
  Filled 2016-02-22: qty 10

## 2016-02-22 MED ORDER — PROCHLORPERAZINE MALEATE 10 MG PO TABS
10.0000 mg | ORAL_TABLET | Freq: Once | ORAL | Status: AC
  Administered 2016-02-22: 10 mg via ORAL

## 2016-02-22 MED ORDER — SODIUM CHLORIDE 0.9 % IV SOLN
Freq: Once | INTRAVENOUS | Status: AC
  Administered 2016-02-22: 09:00:00 via INTRAVENOUS

## 2016-02-22 MED ORDER — PROCHLORPERAZINE MALEATE 10 MG PO TABS
ORAL_TABLET | ORAL | Status: AC
  Filled 2016-02-22: qty 1

## 2016-02-22 NOTE — Patient Instructions (Signed)
[facility] Discharge Instructions for Patients Receiving Chemotherapy  Today you received the following chemotherapy agents Dacogen.  To help prevent nausea and vomiting after your treatment, we encourage you to take your nausea medication.   If you develop nausea and vomiting that is not controlled by your nausea medication, call the clinic.   BELOW ARE SYMPTOMS THAT SHOULD BE REPORTED IMMEDIATELY:  *FEVER GREATER THAN 100.5 F  *CHILLS WITH OR WITHOUT FEVER  NAUSEA AND VOMITING THAT IS NOT CONTROLLED WITH YOUR NAUSEA MEDICATION  *UNUSUAL SHORTNESS OF BREATH  *UNUSUAL BRUISING OR BLEEDING  TENDERNESS IN MOUTH AND THROAT WITH OR WITHOUT PRESENCE OF ULCERS  *URINARY PROBLEMS  *BOWEL PROBLEMS  UNUSUAL RASH Items with * indicate a potential emergency and should be followed up as soon as possible.  Feel free to call the clinic you have any questions or concerns. The clinic phone number is [phone].  Please show the CHEMO ALERT CARD at check-in to the Emergency Department and triage nurse.  

## 2016-02-26 ENCOUNTER — Other Ambulatory Visit (HOSPITAL_BASED_OUTPATIENT_CLINIC_OR_DEPARTMENT_OTHER): Payer: Commercial Managed Care - HMO

## 2016-02-26 DIAGNOSIS — R519 Headache, unspecified: Secondary | ICD-10-CM

## 2016-02-26 DIAGNOSIS — D47Z9 Other specified neoplasms of uncertain behavior of lymphoid, hematopoietic and related tissue: Secondary | ICD-10-CM

## 2016-02-26 DIAGNOSIS — D473 Essential (hemorrhagic) thrombocythemia: Secondary | ICD-10-CM

## 2016-02-26 DIAGNOSIS — D469 Myelodysplastic syndrome, unspecified: Secondary | ICD-10-CM

## 2016-02-26 DIAGNOSIS — R51 Headache: Principal | ICD-10-CM

## 2016-02-26 LAB — CBC WITH DIFFERENTIAL (CANCER CENTER ONLY)
BASO#: 0 10*3/uL (ref 0.0–0.2)
BASO%: 0.7 % (ref 0.0–2.0)
EOS ABS: 0 10*3/uL (ref 0.0–0.5)
EOS%: 0.2 % (ref 0.0–7.0)
HEMATOCRIT: 38.9 % (ref 38.7–49.9)
HEMOGLOBIN: 13.1 g/dL (ref 13.0–17.1)
LYMPH#: 0.9 10*3/uL (ref 0.9–3.3)
LYMPH%: 21.2 % (ref 14.0–48.0)
MCH: 36.4 pg — AB (ref 28.0–33.4)
MCHC: 33.7 g/dL (ref 32.0–35.9)
MCV: 108 fL — ABNORMAL HIGH (ref 82–98)
MONO#: 0.5 10*3/uL (ref 0.1–0.9)
MONO%: 13.3 % — ABNORMAL HIGH (ref 0.0–13.0)
NEUT%: 64.6 % (ref 40.0–80.0)
NEUTROS ABS: 2.6 10*3/uL (ref 1.5–6.5)
Platelets: 1590 10*3/uL — ABNORMAL HIGH (ref 145–400)
RBC: 3.6 10*6/uL — ABNORMAL LOW (ref 4.20–5.70)
RDW: 17.5 % — ABNORMAL HIGH (ref 11.1–15.7)
WBC: 4.1 10*3/uL (ref 4.0–10.0)

## 2016-02-26 LAB — COMPREHENSIVE METABOLIC PANEL
ALBUMIN: 3.7 g/dL (ref 3.5–5.0)
ALK PHOS: 56 U/L (ref 40–150)
ALT: 18 U/L (ref 0–55)
AST: 30 U/L (ref 5–34)
Anion Gap: 7 mEq/L (ref 3–11)
BUN: 13.1 mg/dL (ref 7.0–26.0)
CALCIUM: 9.1 mg/dL (ref 8.4–10.4)
CHLORIDE: 105 meq/L (ref 98–109)
CO2: 25 mEq/L (ref 22–29)
Creatinine: 0.8 mg/dL (ref 0.7–1.3)
Glucose: 74 mg/dl (ref 70–140)
POTASSIUM: 4.1 meq/L (ref 3.5–5.1)
Sodium: 138 mEq/L (ref 136–145)
Total Bilirubin: 0.57 mg/dL (ref 0.20–1.20)
Total Protein: 6.4 g/dL (ref 6.4–8.3)

## 2016-02-29 ENCOUNTER — Other Ambulatory Visit: Payer: Commercial Managed Care - HMO

## 2016-02-29 ENCOUNTER — Encounter: Payer: Self-pay | Admitting: Nurse Practitioner

## 2016-02-29 NOTE — Progress Notes (Signed)
Labs and path reports faxed to Dr. Annabelle Harman at [phone]. Confirmation received.

## 2016-03-03 ENCOUNTER — Other Ambulatory Visit (HOSPITAL_BASED_OUTPATIENT_CLINIC_OR_DEPARTMENT_OTHER): Payer: Commercial Managed Care - HMO

## 2016-03-03 DIAGNOSIS — D469 Myelodysplastic syndrome, unspecified: Secondary | ICD-10-CM

## 2016-03-03 DIAGNOSIS — D47Z9 Other specified neoplasms of uncertain behavior of lymphoid, hematopoietic and related tissue: Secondary | ICD-10-CM | POA: Diagnosis not present

## 2016-03-03 LAB — CBC WITH DIFFERENTIAL (CANCER CENTER ONLY)
BASO#: 0 10*3/uL (ref 0.0–0.2)
BASO%: 0.8 % (ref 0.0–2.0)
EOS ABS: 0 10*3/uL (ref 0.0–0.5)
EOS%: 0.4 % (ref 0.0–7.0)
HCT: 37.7 % — ABNORMAL LOW (ref 38.7–49.9)
HEMOGLOBIN: 12.9 g/dL — AB (ref 13.0–17.1)
LYMPH#: 0.9 10*3/uL (ref 0.9–3.3)
LYMPH%: 35.3 % (ref 14.0–48.0)
MCH: 37 pg — AB (ref 28.0–33.4)
MCHC: 34.2 g/dL (ref 32.0–35.9)
MCV: 108 fL — AB (ref 82–98)
MONO#: 0.4 10*3/uL (ref 0.1–0.9)
MONO%: 14.1 % — ABNORMAL HIGH (ref 0.0–13.0)
NEUT%: 49.4 % (ref 40.0–80.0)
NEUTROS ABS: 1.2 10*3/uL — AB (ref 1.5–6.5)
Platelets: 875 10*3/uL — ABNORMAL HIGH (ref 145–400)
RBC: 3.49 10*6/uL — AB (ref 4.20–5.70)
RDW: 16.8 % — ABNORMAL HIGH (ref 11.1–15.7)
WBC: 2.5 10*3/uL — AB (ref 4.0–10.0)

## 2016-03-03 LAB — COMPREHENSIVE METABOLIC PANEL
ALBUMIN: 3.7 g/dL (ref 3.5–5.0)
ALK PHOS: 58 U/L (ref 40–150)
ALT: 19 U/L (ref 0–55)
ANION GAP: 6 meq/L (ref 3–11)
AST: 15 U/L (ref 5–34)
BILIRUBIN TOTAL: 0.51 mg/dL (ref 0.20–1.20)
BUN: 14.4 mg/dL (ref 7.0–26.0)
CO2: 27 mEq/L (ref 22–29)
Calcium: 9 mg/dL (ref 8.4–10.4)
Chloride: 103 mEq/L (ref 98–109)
Creatinine: 0.9 mg/dL (ref 0.7–1.3)
Glucose: 170 mg/dl — ABNORMAL HIGH (ref 70–140)
Potassium: 4.2 mEq/L (ref 3.5–5.1)
Sodium: 136 mEq/L (ref 136–145)
TOTAL PROTEIN: 6.7 g/dL (ref 6.4–8.3)

## 2016-03-10 ENCOUNTER — Other Ambulatory Visit: Payer: Commercial Managed Care - HMO

## 2016-03-17 ENCOUNTER — Ambulatory Visit: Payer: Commercial Managed Care - HMO | Admitting: Hematology & Oncology

## 2016-03-17 ENCOUNTER — Ambulatory Visit: Payer: Commercial Managed Care - HMO

## 2016-03-17 ENCOUNTER — Other Ambulatory Visit: Payer: Commercial Managed Care - HMO

## 2016-03-18 ENCOUNTER — Ambulatory Visit: Payer: Commercial Managed Care - HMO

## 2016-03-19 ENCOUNTER — Ambulatory Visit: Payer: Commercial Managed Care - HMO

## 2016-03-20 ENCOUNTER — Ambulatory Visit: Payer: Commercial Managed Care - HMO

## 2016-03-21 ENCOUNTER — Ambulatory Visit: Payer: Commercial Managed Care - HMO

## 2016-03-21 DIAGNOSIS — E109 Type 1 diabetes mellitus without complications: Secondary | ICD-10-CM | POA: Insufficient documentation

## 2016-03-24 ENCOUNTER — Other Ambulatory Visit (HOSPITAL_BASED_OUTPATIENT_CLINIC_OR_DEPARTMENT_OTHER): Payer: Commercial Managed Care - HMO

## 2016-03-24 ENCOUNTER — Other Ambulatory Visit: Payer: Commercial Managed Care - HMO

## 2016-03-24 ENCOUNTER — Encounter: Payer: Self-pay | Admitting: Hematology & Oncology

## 2016-03-24 ENCOUNTER — Ambulatory Visit (HOSPITAL_BASED_OUTPATIENT_CLINIC_OR_DEPARTMENT_OTHER): Payer: Commercial Managed Care - HMO | Admitting: Hematology & Oncology

## 2016-03-24 ENCOUNTER — Ambulatory Visit (HOSPITAL_BASED_OUTPATIENT_CLINIC_OR_DEPARTMENT_OTHER): Payer: Commercial Managed Care - HMO

## 2016-03-24 VITALS — BP 135/73 | HR 101 | Temp 98.5°F | Resp 20 | Ht 73.0 in | Wt 275.0 lb

## 2016-03-24 DIAGNOSIS — E119 Type 2 diabetes mellitus without complications: Secondary | ICD-10-CM | POA: Diagnosis not present

## 2016-03-24 DIAGNOSIS — D469 Myelodysplastic syndrome, unspecified: Secondary | ICD-10-CM | POA: Diagnosis not present

## 2016-03-24 DIAGNOSIS — R519 Headache, unspecified: Secondary | ICD-10-CM

## 2016-03-24 DIAGNOSIS — D47Z9 Other specified neoplasms of uncertain behavior of lymphoid, hematopoietic and related tissue: Secondary | ICD-10-CM | POA: Diagnosis not present

## 2016-03-24 DIAGNOSIS — D473 Essential (hemorrhagic) thrombocythemia: Secondary | ICD-10-CM

## 2016-03-24 DIAGNOSIS — Z5111 Encounter for antineoplastic chemotherapy: Secondary | ICD-10-CM | POA: Diagnosis not present

## 2016-03-24 DIAGNOSIS — R51 Headache: Principal | ICD-10-CM

## 2016-03-24 DIAGNOSIS — Z298 Encounter for other specified prophylactic measures: Secondary | ICD-10-CM

## 2016-03-24 LAB — CMP (CANCER CENTER ONLY)
ALBUMIN: 3.4 g/dL (ref 3.3–5.5)
ALK PHOS: 57 U/L (ref 26–84)
ALT: 18 U/L (ref 10–47)
AST: 18 U/L (ref 11–38)
BILIRUBIN TOTAL: 0.7 mg/dL (ref 0.20–1.60)
BUN, Bld: 15 mg/dL (ref 7–22)
CALCIUM: 8.8 mg/dL (ref 8.0–10.3)
CO2: 28 mEq/L (ref 18–33)
Chloride: 100 mEq/L (ref 98–108)
Creat: 0.8 mg/dl (ref 0.6–1.2)
GLUCOSE: 259 mg/dL — AB (ref 73–118)
POTASSIUM: 4.2 meq/L (ref 3.3–4.7)
SODIUM: 134 meq/L (ref 128–145)
TOTAL PROTEIN: 6.2 g/dL — AB (ref 6.4–8.1)

## 2016-03-24 LAB — CBC WITH DIFFERENTIAL (CANCER CENTER ONLY)
BASO#: 0 10*3/uL (ref 0.0–0.2)
BASO%: 0.6 % (ref 0.0–2.0)
EOS ABS: 0 10*3/uL (ref 0.0–0.5)
EOS%: 0 % (ref 0.0–7.0)
HCT: 38.2 % — ABNORMAL LOW (ref 38.7–49.9)
HGB: 13.2 g/dL (ref 13.0–17.1)
LYMPH#: 1.1 10*3/uL (ref 0.9–3.3)
LYMPH%: 22.6 % (ref 14.0–48.0)
MCH: 37.9 pg — AB (ref 28.0–33.4)
MCHC: 34.6 g/dL (ref 32.0–35.9)
MCV: 110 fL — AB (ref 82–98)
MONO#: 1.3 10*3/uL — ABNORMAL HIGH (ref 0.1–0.9)
MONO%: 25.6 % — AB (ref 0.0–13.0)
NEUT#: 2.6 10*3/uL (ref 1.5–6.5)
NEUT%: 51.2 % (ref 40.0–80.0)
RBC: 3.48 10*6/uL — ABNORMAL LOW (ref 4.20–5.70)
RDW: 20 % — ABNORMAL HIGH (ref 11.1–15.7)
WBC: 5 10*3/uL (ref 4.0–10.0)

## 2016-03-24 MED ORDER — DECITABINE CHEMO INJECTION 50 MG
20.0000 mg/m2 | Freq: Once | INTRAVENOUS | Status: AC
  Administered 2016-03-24: 50 mg via INTRAVENOUS
  Filled 2016-03-24: qty 10

## 2016-03-24 MED ORDER — SODIUM CHLORIDE 0.9 % IV SOLN
Freq: Once | INTRAVENOUS | Status: AC
  Administered 2016-03-24: 13:00:00 via INTRAVENOUS

## 2016-03-24 MED ORDER — PROCHLORPERAZINE MALEATE 10 MG PO TABS
ORAL_TABLET | ORAL | Status: AC
  Filled 2016-03-24: qty 1

## 2016-03-24 MED ORDER — AMOXICILLIN 500 MG PO CAPS: ORAL_CAPSULE | ORAL | Status: DC

## 2016-03-24 MED ORDER — PROCHLORPERAZINE MALEATE 10 MG PO TABS
10.0000 mg | ORAL_TABLET | Freq: Once | ORAL | Status: AC
  Administered 2016-03-24: 10 mg via ORAL

## 2016-03-24 NOTE — Patient Instructions (Signed)
Decitabine injection for infusion What is this medicine? DECITABINE (dee SYE ta been) is a chemotherapy drug. This medicine reduces the growth of cancer cells. It is used to treat adults with myelodysplastic syndromes. This medicine may be used for other purposes; ask your health care provider or pharmacist if you have questions. What should I tell my health care provider before I take this medicine? They need to know if you have any of these conditions: -infection (especially a virus infection such as chickenpox, cold sores, or herpes) -kidney disease -liver disease -an unusual or allergic reaction to decitabine, other medicines, foods, dyes, or preservatives -pregnant or trying to get pregnant -breast-feeding How should I use this medicine? This medicine is for infusion into a vein. It is administered in a hospital or clinic by a doctor or health care professional. Talk to your pediatrician regarding the use of this medicine in children. Special care may be needed. Overdosage: If you think you have taken too much of this medicine contact a poison control center or emergency room at once. NOTE: This medicine is only for you. Do not share this medicine with others. What if I miss a dose? It is important not to miss your dose. Call your doctor or health care professional if you are unable to keep an appointment. What may interact with this medicine? -vaccines Talk to your doctor or health care professional before taking any of these medicines: -aspirin -acetaminophen -ibuprofen -ketoprofen -naproxen This list may not describe all possible interactions. Give your health care provider a list of all the medicines, herbs, non-prescription drugs, or dietary supplements you use. Also tell them if you smoke, drink alcohol, or use illegal drugs. Some items may interact with your medicine. What should I watch for while using this medicine? Visit your doctor for checks on your progress. This drug  may make you feel generally unwell. This is not uncommon, as chemotherapy can affect healthy cells as well as cancer cells. Report any side effects. Continue your course of treatment even though you feel ill unless your doctor tells you to stop. In some cases, you may be given additional medicines to help with side effects. Follow all directions for their use. Call your doctor or health care professional for advice if you get a fever, chills or sore throat, or other symptoms of a cold or flu. Do not treat yourself. This drug decreases your body's ability to fight infections. Try to avoid being around people who are sick. This medicine may increase your risk to bruise or bleed. Call your doctor or health care professional if you notice any unusual bleeding. Do not become pregnant while taking this medicine or for at least 1 month after stopping it. Women should inform their doctor if they wish to become pregnant or think they might be pregnant. Men should not father a child while taking this medicine and for at least 2 months after stopping it. There is a potential for serious side effects to an unborn child. Talk to your health care professional or pharmacist for more information. Do not breast-feed an infant while taking this medicine. What side effects may I notice from receiving this medicine? Side effects that you should report to your doctor or health care professional as soon as possible: -low blood counts - this medicine may decrease the number of white blood cells, red blood cells and platelets. You may be at increased risk for infections and bleeding. -signs of infection - fever or chills, cough, sore throat,  pain or difficulty passing urine -signs of decreased platelets or bleeding - bruising, pinpoint red spots on the skin, black, tarry stools, blood in the urine -signs of decreased red blood cells - unusual weakness or tiredness, fainting spells, lightheadedness -increased blood sugar Side  effects that usually do not require medical attention (report to your prescriber or health care professional if they continue or are bothersome): -constipation -diarrhea -headache -loss of appetite -nausea, vomiting -skin rash, itching -stomach pain -water retention -weak or tired This list may not describe all possible side effects. Call your doctor for medical advice about side effects. You may report side effects to FDA at 1-800-FDA-1088. Where should I keep my medicine? This drug is given in a hospital or clinic and will not be stored at home. NOTE: This sheet is a summary. It may not cover all possible information. If you have questions about this medicine, talk to your doctor, pharmacist, or health care provider.    2016, [employer]vier/[employer] Standard. (2015-04-17 12:56:02)

## 2016-03-24 NOTE — Progress Notes (Signed)
Hematology and Oncology Follow Up Visit  EMORY GALLENTINE [id number] 10-29-1970 45 y.o. 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 Husain is back for follow-up. 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 We sent him out to [facility] for a second opinion. They repeated a bone marrow biopsy on him. 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 He had an increased number of blasts.  They felt that he would benefit from a alginate bone marrow transplant. As such, he is being worked up for this. In the meantime, they wished that he start treatment to try to help control his blood counts and to help with his last percentage. We have started him on Dacogen. I talked to him about this. The doctors at [facility] talk to him about this. He understands was some of the side effects are.   He is having some problems with his left foot. He's not able to work for 3 weeks.   He still has his diabetes. He is on the insulin pump. He is tolerating this pretty well.  He's had no bleeding. He's had no rashes. He's had no fever. He's had no change in bowel or bladder habits.  Overall, his performance status is ECOG 1.   Medications:  Current outpatient prescriptions:  .  aspirin 81 MG chewable tablet, Chew 81 mg by mouth., Disp: , Rfl:  .  cetirizine (ZYRTEC) 10 MG tablet, Take by mouth., Disp: , Rfl:  .  dexlansoprazole (DEXILANT) 60 MG capsule, Take by mouth., Disp: , Rfl:  .  ezetimibe (ZETIA) 10 MG tablet, Take 10 mg by mouth., Disp: , Rfl:  .  hydroxyurea (HYDREA) 500 MG capsule, Take 1,000 mg by mouth 3 (three) times daily., Disp: , Rfl:  .  ibuprofen (ADVIL,MOTRIN) 800 MG tablet, Take by mouth., Disp: , Rfl:  .  indapamide (LOZOL) 1.25 MG tablet, Take 1.25 mg by mouth., Disp: , Rfl:  .  insulin aspart (NOVOLOG) 100  UNIT/ML injection, Inject into the skin., Disp: , Rfl:  .  loratadine (CLARITIN) 10 MG tablet, Take by mouth., Disp: , Rfl:  .  magic mouthwash w/lidocaine SOLN, Take 30 mLs by mouth 4 (four) times daily., Disp: , Rfl:  .  Multiple Vitamin (MULTI-VITAMINS) TABS, Take by mouth., Disp: , Rfl:  .  ondansetron (ZOFRAN) 8 MG tablet, Take 1 tablet (8 mg total) by mouth every 8 (eight) hours as needed for nausea or vomiting., Disp: 30 tablet, Rfl: 2 .  ONE TOUCH ULTRA TEST test strip, USE AS DIRECTED. TESTING FREQUENCY: 2-4 TIMES DAILY., Disp: , Rfl: 11 .  pioglitazone (ACTOS) 30 MG tablet, Take 30 mg by mouth., Disp: , Rfl:  .  prochlorperazine (COMPAZINE) 10 MG tablet, Take 1 tablet (10 mg total) by mouth every 6 (six) hours as needed (Nausea or vomiting)., Disp: 30 tablet, Rfl: 1 .  ramipril (ALTACE) 10 MG capsule, Take 10 mg by mouth., Disp: , Rfl:  .  rosuvastatin (CRESTOR) 20 MG tablet, , Disp: , Rfl:  .  TOUJEO SOLOSTAR 300 UNIT/ML SOPN, TAKE 40 UNITS BEFORE BREAKFAST DAILY, Disp: , Rfl: 12 .  traMADol (ULTRAM) 50 MG tablet, Take 50 mg by mouth every 6 (six) hours as needed., Disp: , Rfl:   Allergies: No Known Allergies  Past  Medical History, Surgical history, Social history, and Family History were reviewed and updated.  Review of Systems: As above  Physical Exam:  height is '6\' 1"'$  (1.854 m) and weight is 275 lb (124.739 kg). His oral temperature is 98.5 F (36.9 C). His blood pressure is 135/73 and his pulse is 101. His respiration is 20.   Wt Readings from Last 3 Encounters:  03/24/16 275 lb (124.739 kg)  02/18/16 275 lb (124.739 kg)  01/22/16 269 lb 8 oz (122.244 kg)     Obese white male in no obvious distress. Head and neck exam shows no ocular or oral lesions. He has no palpable cervical or supraclavicular lymph nodes. Lungs are clear. Cardiac exam regular rate and rhythm with no murmurs, rubs or bruits. Abdomen is soft. He has good bowel sounds. There is no fluid wave. There is  no palpable liver or spleen tip. Back exam shows no tenderness over the spine, ribs or hips. Extremity shows no clubbing, cyanosis or edema. Skin exam shows no rashes, ecchymoses or petechia. Neurological exam shows no focal neurological deficits.  Lab Results  Component Value Date   WBC 5.0 03/24/2016   HGB 13.2 03/24/2016   HCT 38.2* 03/24/2016   MCV 110* 03/24/2016   PLT 1,976* 03/24/2016     Chemistry      Component Value Date/Time   NA 134 03/24/2016 1145   NA 136 03/03/2016 1147   NA 134 02/04/2016 1425   NA 139 05/18/2007 1542   K 4.2 03/24/2016 1145   K 4.2 03/03/2016 1147   K 4.2 02/04/2016 1425   K 4.5 05/18/2007 1542   CL 100 03/24/2016 1145   CL 101 02/04/2016 1425   CL 103 05/18/2007 1542   CO2 28 03/24/2016 1145   CO2 27 03/03/2016 1147   CO2 26 02/04/2016 1425   BUN 15 03/24/2016 1145   BUN 14.4 03/03/2016 1147   BUN 13 02/04/2016 1425   BUN 11 05/18/2007 1542   CREATININE 0.8 03/24/2016 1145   CREATININE 0.9 03/03/2016 1147   CREATININE 1.03 02/04/2016 1425      Component Value Date/Time   CALCIUM 8.8 03/24/2016 1145   CALCIUM 9.0 03/03/2016 1147   CALCIUM 9.0 02/04/2016 1425   ALKPHOS 57 03/24/2016 1145   ALKPHOS 58 03/03/2016 1147   ALKPHOS 53 02/04/2016 1425   AST 18 03/24/2016 1145   AST 15 03/03/2016 1147   AST 13 02/04/2016 1425   ALT 18 03/24/2016 1145   ALT 19 03/03/2016 1147   ALT 12 02/04/2016 1425   BILITOT 0.70 03/24/2016 1145   BILITOT 0.51 03/03/2016 1147   BILITOT 0.3 02/04/2016 1425         Impression and Plan: Mr. Schwertner is a 45 year old white male. He has a hybrid myelodysplastic/myeloproliferative disorder. I suspect that the fact that he has increased blasts is what is driving the decisions for therapy.   ITotally agree with the physicians at [facility] regarding transplant. 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  He has a complete match from a 47 year old. This is very fortunate.  Hopefully, he will get into transplant within a month.  For now, we'll increase his Hydrea up to 1 g 3 times a day. We have to get his platelet count down. I think the increased bruising and bleeding probably reflects acquired von Willebrand disease which he has because the higher the platelets the more likely this happens.   Overall, he is  in great shape so he can tolerate an allogeneic transplant. I told him that even with an allogeneic transplant, and there is no obvious guarantee that this will cure him of his marrow disorder. However, I think the chances clearly are in his favor.   We will continue to monitor his blood counts weekly until he gets the go-ahead for his transplant.   I probably will not see him back until that I spent about 40 minutes with he and his wife today.[name], MD 6/26/201712:58 PM

## 2016-03-25 ENCOUNTER — Ambulatory Visit (HOSPITAL_BASED_OUTPATIENT_CLINIC_OR_DEPARTMENT_OTHER): Payer: Commercial Managed Care - HMO

## 2016-03-25 VITALS — BP 120/64 | HR 94 | Temp 97.5°F | Resp 20

## 2016-03-25 DIAGNOSIS — Z5111 Encounter for antineoplastic chemotherapy: Secondary | ICD-10-CM

## 2016-03-25 DIAGNOSIS — D47Z9 Other specified neoplasms of uncertain behavior of lymphoid, hematopoietic and related tissue: Secondary | ICD-10-CM

## 2016-03-25 DIAGNOSIS — D469 Myelodysplastic syndrome, unspecified: Secondary | ICD-10-CM

## 2016-03-25 MED ORDER — SODIUM CHLORIDE 0.9 % IV SOLN
Freq: Once | INTRAVENOUS | Status: AC
  Administered 2016-03-25: 10:00:00 via INTRAVENOUS

## 2016-03-25 MED ORDER — DECITABINE CHEMO INJECTION 50 MG
20.0000 mg/m2 | Freq: Once | INTRAVENOUS | Status: AC
  Administered 2016-03-25: 50 mg via INTRAVENOUS
  Filled 2016-03-25: qty 10

## 2016-03-25 MED ORDER — PROCHLORPERAZINE MALEATE 10 MG PO TABS
10.0000 mg | ORAL_TABLET | Freq: Once | ORAL | Status: AC
  Administered 2016-03-25: 10 mg via ORAL

## 2016-03-25 MED ORDER — PROCHLORPERAZINE MALEATE 10 MG PO TABS
ORAL_TABLET | ORAL | Status: AC
  Filled 2016-03-25: qty 1

## 2016-03-25 NOTE — Patient Instructions (Signed)
Decitabine injection for infusion What is this medicine? DECITABINE (dee SYE ta been) is a chemotherapy drug. This medicine reduces the growth of cancer cells. It is used to treat adults with myelodysplastic syndromes. This medicine may be used for other purposes; ask your health care provider or pharmacist if you have questions. What should I tell my health care provider before I take this medicine? They need to know if you have any of these conditions: -infection (especially a virus infection such as chickenpox, cold sores, or herpes) -kidney disease -liver disease -an unusual or allergic reaction to decitabine, other medicines, foods, dyes, or preservatives -pregnant or trying to get pregnant -breast-feeding How should I use this medicine? This medicine is for infusion into a vein. It is administered in a hospital or clinic by a doctor or health care professional. Talk to your pediatrician regarding the use of this medicine in children. Special care may be needed. Overdosage: If you think you have taken too much of this medicine contact a poison control center or emergency room at once. NOTE: This medicine is only for you. Do not share this medicine with others. What if I miss a dose? It is important not to miss your dose. Call your doctor or health care professional if you are unable to keep an appointment. What may interact with this medicine? -vaccines Talk to your doctor or health care professional before taking any of these medicines: -aspirin -acetaminophen -ibuprofen -ketoprofen -naproxen This list may not describe all possible interactions. Give your health care provider a list of all the medicines, herbs, non-prescription drugs, or dietary supplements you use. Also tell them if you smoke, drink alcohol, or use illegal drugs. Some items may interact with your medicine. What should I watch for while using this medicine? Visit your doctor for checks on your progress. This drug  may make you feel generally unwell. This is not uncommon, as chemotherapy can affect healthy cells as well as cancer cells. Report any side effects. Continue your course of treatment even though you feel ill unless your doctor tells you to stop. In some cases, you may be given additional medicines to help with side effects. Follow all directions for their use. Call your doctor or health care professional for advice if you get a fever, chills or sore throat, or other symptoms of a cold or flu. Do not treat yourself. This drug decreases your body's ability to fight infections. Try to avoid being around people who are sick. This medicine may increase your risk to bruise or bleed. Call your doctor or health care professional if you notice any unusual bleeding. Do not become pregnant while taking this medicine or for at least 1 month after stopping it. Women should inform their doctor if they wish to become pregnant or think they might be pregnant. Men should not father a child while taking this medicine and for at least 2 months after stopping it. There is a potential for serious side effects to an unborn child. Talk to your health care professional or pharmacist for more information. Do not breast-feed an infant while taking this medicine. What side effects may I notice from receiving this medicine? Side effects that you should report to your doctor or health care professional as soon as possible: -low blood counts - this medicine may decrease the number of white blood cells, red blood cells and platelets. You may be at increased risk for infections and bleeding. -signs of infection - fever or chills, cough, sore throat,   pain or difficulty passing urine -signs of decreased platelets or bleeding - bruising, pinpoint red spots on the skin, black, tarry stools, blood in the urine -signs of decreased red blood cells - unusual weakness or tiredness, fainting spells, lightheadedness -increased blood sugar Side  effects that usually do not require medical attention (report to your prescriber or health care professional if they continue or are bothersome): -constipation -diarrhea -headache -loss of appetite -nausea, vomiting -skin rash, itching -stomach pain -water retention -weak or tired This list may not describe all possible side effects. Call your doctor for medical advice about side effects. You may report side effects to FDA at 1-800-FDA-1088. Where should I keep my medicine? This drug is given in a hospital or clinic and will not be stored at home. NOTE: This sheet is a summary. It may not cover all possible information. If you have questions about this medicine, talk to your doctor, pharmacist, or health care provider.    2016, [employer]. (2015-04-17 12:56:02)

## 2016-03-26 ENCOUNTER — Ambulatory Visit (HOSPITAL_BASED_OUTPATIENT_CLINIC_OR_DEPARTMENT_OTHER): Payer: Commercial Managed Care - HMO

## 2016-03-26 VITALS — BP 125/74 | HR 88 | Temp 97.7°F | Resp 20

## 2016-03-26 DIAGNOSIS — D469 Myelodysplastic syndrome, unspecified: Secondary | ICD-10-CM | POA: Diagnosis not present

## 2016-03-26 DIAGNOSIS — D47Z9 Other specified neoplasms of uncertain behavior of lymphoid, hematopoietic and related tissue: Secondary | ICD-10-CM | POA: Diagnosis not present

## 2016-03-26 DIAGNOSIS — Z5111 Encounter for antineoplastic chemotherapy: Secondary | ICD-10-CM | POA: Diagnosis not present

## 2016-03-26 MED ORDER — DECITABINE CHEMO INJECTION 50 MG
20.0000 mg/m2 | Freq: Once | INTRAVENOUS | Status: AC
  Administered 2016-03-26: 50 mg via INTRAVENOUS
  Filled 2016-03-26: qty 10

## 2016-03-26 MED ORDER — PROCHLORPERAZINE MALEATE 10 MG PO TABS
10.0000 mg | ORAL_TABLET | Freq: Once | ORAL | Status: AC
  Administered 2016-03-26: 10 mg via ORAL

## 2016-03-26 MED ORDER — PROCHLORPERAZINE MALEATE 10 MG PO TABS
ORAL_TABLET | ORAL | Status: AC
  Filled 2016-03-26: qty 1

## 2016-03-26 MED ORDER — SODIUM CHLORIDE 0.9 % IV SOLN
Freq: Once | INTRAVENOUS | Status: AC
  Administered 2016-03-26: 09:00:00 via INTRAVENOUS

## 2016-03-26 NOTE — Patient Instructions (Signed)
Decitabine injection for infusion What is this medicine? DECITABINE (dee SYE ta been) is a chemotherapy drug. This medicine reduces the growth of cancer cells. It is used to treat adults with myelodysplastic syndromes. This medicine may be used for other purposes; ask your health care provider or pharmacist if you have questions. What should I tell my health care provider before I take this medicine? They need to know if you have any of these conditions: -infection (especially a virus infection such as chickenpox, cold sores, or herpes) -kidney disease -liver disease -an unusual or allergic reaction to decitabine, other medicines, foods, dyes, or preservatives -pregnant or trying to get pregnant -breast-feeding How should I use this medicine? This medicine is for infusion into a vein. It is administered in a hospital or clinic by a doctor or health care professional. Talk to your pediatrician regarding the use of this medicine in children. Special care may be needed. Overdosage: If you think you have taken too much of this medicine contact a poison control center or emergency room at once. NOTE: This medicine is only for you. Do not share this medicine with others. What if I miss a dose? It is important not to miss your dose. Call your doctor or health care professional if you are unable to keep an appointment. What may interact with this medicine? -vaccines Talk to your doctor or health care professional before taking any of these medicines: -aspirin -acetaminophen -ibuprofen -ketoprofen -naproxen This list may not describe all possible interactions. Give your health care provider a list of all the medicines, herbs, non-prescription drugs, or dietary supplements you use. Also tell them if you smoke, drink alcohol, or use illegal drugs. Some items may interact with your medicine. What should I watch for while using this medicine? Visit your doctor for checks on your progress. This drug  may make you feel generally unwell. This is not uncommon, as chemotherapy can affect healthy cells as well as cancer cells. Report any side effects. Continue your course of treatment even though you feel ill unless your doctor tells you to stop. In some cases, you may be given additional medicines to help with side effects. Follow all directions for their use. Call your doctor or health care professional for advice if you get a fever, chills or sore throat, or other symptoms of a cold or flu. Do not treat yourself. This drug decreases your body's ability to fight infections. Try to avoid being around people who are sick. This medicine may increase your risk to bruise or bleed. Call your doctor or health care professional if you notice any unusual bleeding. Do not become pregnant while taking this medicine or for at least 1 month after stopping it. Women should inform their doctor if they wish to become pregnant or think they might be pregnant. Men should not father a child while taking this medicine and for at least 2 months after stopping it. There is a potential for serious side effects to an unborn child. Talk to your health care professional or pharmacist for more information. Do not breast-feed an infant while taking this medicine. What side effects may I notice from receiving this medicine? Side effects that you should report to your doctor or health care professional as soon as possible: -low blood counts - this medicine may decrease the number of white blood cells, red blood cells and platelets. You may be at increased risk for infections and bleeding. -signs of infection - fever or chills, cough, sore throat,  pain or difficulty passing urine -signs of decreased platelets or bleeding - bruising, pinpoint red spots on the skin, black, tarry stools, blood in the urine -signs of decreased red blood cells - unusual weakness or tiredness, fainting spells, lightheadedness -increased blood sugar Side  effects that usually do not require medical attention (report to your prescriber or health care professional if they continue or are bothersome): -constipation -diarrhea -headache -loss of appetite -nausea, vomiting -skin rash, itching -stomach pain -water retention -weak or tired This list may not describe all possible side effects. Call your doctor for medical advice about side effects. You may report side effects to FDA at 1-800-FDA-1088. Where should I keep my medicine? This drug is given in a hospital or clinic and will not be stored at home. NOTE: This sheet is a summary. It may not cover all possible information. If you have questions about this medicine, talk to your doctor, pharmacist, or health care provider.    2016, [employer]vier/[employer] Standard. (2015-04-17 12:56:02)

## 2016-03-27 ENCOUNTER — Ambulatory Visit (HOSPITAL_BASED_OUTPATIENT_CLINIC_OR_DEPARTMENT_OTHER): Payer: Commercial Managed Care - HMO

## 2016-03-27 ENCOUNTER — Other Ambulatory Visit (HOSPITAL_BASED_OUTPATIENT_CLINIC_OR_DEPARTMENT_OTHER): Payer: Commercial Managed Care - HMO

## 2016-03-27 VITALS — BP 131/68 | HR 96 | Temp 97.8°F | Resp 20

## 2016-03-27 DIAGNOSIS — D47Z9 Other specified neoplasms of uncertain behavior of lymphoid, hematopoietic and related tissue: Secondary | ICD-10-CM | POA: Diagnosis not present

## 2016-03-27 DIAGNOSIS — Z5111 Encounter for antineoplastic chemotherapy: Secondary | ICD-10-CM

## 2016-03-27 DIAGNOSIS — D469 Myelodysplastic syndrome, unspecified: Secondary | ICD-10-CM

## 2016-03-27 LAB — CBC WITH DIFFERENTIAL (CANCER CENTER ONLY)
BASO#: 0 10*3/uL (ref 0.0–0.2)
BASO%: 0.6 % (ref 0.0–2.0)
EOS%: 0 % (ref 0.0–7.0)
Eosinophils Absolute: 0 10*3/uL (ref 0.0–0.5)
HEMATOCRIT: 39.6 % (ref 38.7–49.9)
HEMOGLOBIN: 13.4 g/dL (ref 13.0–17.1)
LYMPH#: 1.4 10*3/uL (ref 0.9–3.3)
LYMPH%: 25.4 % (ref 14.0–48.0)
MCH: 37.3 pg — AB (ref 28.0–33.4)
MCHC: 33.8 g/dL (ref 32.0–35.9)
MCV: 110 fL — AB (ref 82–98)
MONO#: 1.4 10*3/uL — AB (ref 0.1–0.9)
MONO%: 25.2 % — ABNORMAL HIGH (ref 0.0–13.0)
NEUT#: 2.7 10*3/uL (ref 1.5–6.5)
NEUT%: 48.8 % (ref 40.0–80.0)
Platelets: 2197 10*3/uL — ABNORMAL HIGH (ref 145–400)
RBC: 3.59 10*6/uL — AB (ref 4.20–5.70)
RDW: 20.7 % — ABNORMAL HIGH (ref 11.1–15.7)
WBC: 5.4 10*3/uL (ref 4.0–10.0)

## 2016-03-27 LAB — CMP (CANCER CENTER ONLY)
ALBUMIN: 3.4 g/dL (ref 3.3–5.5)
ALK PHOS: 58 U/L (ref 26–84)
ALT: 22 U/L (ref 10–47)
AST: 21 U/L (ref 11–38)
BUN: 16 mg/dL (ref 7–22)
CALCIUM: 8.9 mg/dL (ref 8.0–10.3)
CHLORIDE: 101 meq/L (ref 98–108)
CO2: 26 mEq/L (ref 18–33)
Creat: 0.8 mg/dl (ref 0.6–1.2)
Glucose, Bld: 192 mg/dL — ABNORMAL HIGH (ref 73–118)
POTASSIUM: 4.1 meq/L (ref 3.3–4.7)
Sodium: 136 mEq/L (ref 128–145)
TOTAL PROTEIN: 6.3 g/dL — AB (ref 6.4–8.1)
Total Bilirubin: 0.7 mg/dl (ref 0.20–1.60)

## 2016-03-27 MED ORDER — PROCHLORPERAZINE MALEATE 10 MG PO TABS
10.0000 mg | ORAL_TABLET | Freq: Once | ORAL | Status: AC
  Administered 2016-03-27: 10 mg via ORAL

## 2016-03-27 MED ORDER — SODIUM CHLORIDE 0.9 % IV SOLN
Freq: Once | INTRAVENOUS | Status: AC
  Administered 2016-03-27: 10:00:00 via INTRAVENOUS

## 2016-03-27 MED ORDER — PROCHLORPERAZINE MALEATE 10 MG PO TABS
ORAL_TABLET | ORAL | Status: AC
  Filled 2016-03-27: qty 1

## 2016-03-27 MED ORDER — SODIUM CHLORIDE 0.9 % IV SOLN
20.0000 mg/m2 | Freq: Once | INTRAVENOUS | Status: AC
  Administered 2016-03-27: 50 mg via INTRAVENOUS
  Filled 2016-03-27: qty 10

## 2016-03-27 NOTE — Patient Instructions (Signed)
Decitabine injection for infusion What is this medicine? DECITABINE (dee SYE ta been) is a chemotherapy drug. This medicine reduces the growth of cancer cells. It is used to treat adults with myelodysplastic syndromes. This medicine may be used for other purposes; ask your health care provider or pharmacist if you have questions. What should I tell my health care provider before I take this medicine? They need to know if you have any of these conditions: -infection (especially a virus infection such as chickenpox, cold sores, or herpes) -kidney disease -liver disease -an unusual or allergic reaction to decitabine, other medicines, foods, dyes, or preservatives -pregnant or trying to get pregnant -breast-feeding How should I use this medicine? This medicine is for infusion into a vein. It is administered in a hospital or clinic by a doctor or health care professional. Talk to your pediatrician regarding the use of this medicine in children. Special care may be needed. Overdosage: If you think you have taken too much of this medicine contact a poison control center or emergency room at once. NOTE: This medicine is only for you. Do not share this medicine with others. What if I miss a dose? It is important not to miss your dose. Call your doctor or health care professional if you are unable to keep an appointment. What may interact with this medicine? -vaccines Talk to your doctor or health care professional before taking any of these medicines: -aspirin -acetaminophen -ibuprofen -ketoprofen -naproxen This list may not describe all possible interactions. Give your health care provider a list of all the medicines, herbs, non-prescription drugs, or dietary supplements you use. Also tell them if you smoke, drink alcohol, or use illegal drugs. Some items may interact with your medicine. What should I watch for while using this medicine? Visit your doctor for checks on your progress. This drug  may make you feel generally unwell. This is not uncommon, as chemotherapy can affect healthy cells as well as cancer cells. Report any side effects. Continue your course of treatment even though you feel ill unless your doctor tells you to stop. In some cases, you may be given additional medicines to help with side effects. Follow all directions for their use. Call your doctor or health care professional for advice if you get a fever, chills or sore throat, or other symptoms of a cold or flu. Do not treat yourself. This drug decreases your body's ability to fight infections. Try to avoid being around people who are sick. This medicine may increase your risk to bruise or bleed. Call your doctor or health care professional if you notice any unusual bleeding. Do not become pregnant while taking this medicine or for at least 1 month after stopping it. Women should inform their doctor if they wish to become pregnant or think they might be pregnant. Men should not father a child while taking this medicine and for at least 2 months after stopping it. There is a potential for serious side effects to an unborn child. Talk to your health care professional or pharmacist for more information. Do not breast-feed an infant while taking this medicine. What side effects may I notice from receiving this medicine? Side effects that you should report to your doctor or health care professional as soon as possible: -low blood counts - this medicine may decrease the number of white blood cells, red blood cells and platelets. You may be at increased risk for infections and bleeding. -signs of infection - fever or chills, cough, sore throat,   pain or difficulty passing urine -signs of decreased platelets or bleeding - bruising, pinpoint red spots on the skin, black, tarry stools, blood in the urine -signs of decreased red blood cells - unusual weakness or tiredness, fainting spells, lightheadedness -increased blood sugar Side  effects that usually do not require medical attention (report to your prescriber or health care professional if they continue or are bothersome): -constipation -diarrhea -headache -loss of appetite -nausea, vomiting -skin rash, itching -stomach pain -water retention -weak or tired This list may not describe all possible side effects. Call your doctor for medical advice about side effects. You may report side effects to FDA at 1-800-FDA-1088. Where should I keep my medicine? This drug is given in a hospital or clinic and will not be stored at home. NOTE: This sheet is a summary. It may not cover all possible information. If you have questions about this medicine, talk to your doctor, pharmacist, or health care provider.    2016, [employer]. (2015-04-17 12:56:02)

## 2016-03-28 ENCOUNTER — Ambulatory Visit (HOSPITAL_BASED_OUTPATIENT_CLINIC_OR_DEPARTMENT_OTHER): Payer: Commercial Managed Care - HMO

## 2016-03-28 VITALS — BP 126/75 | HR 91 | Temp 98.2°F | Resp 20

## 2016-03-28 DIAGNOSIS — Z5111 Encounter for antineoplastic chemotherapy: Secondary | ICD-10-CM

## 2016-03-28 DIAGNOSIS — D469 Myelodysplastic syndrome, unspecified: Secondary | ICD-10-CM

## 2016-03-28 DIAGNOSIS — D479 Neoplasm of uncertain behavior of lymphoid, hematopoietic and related tissue, unspecified: Secondary | ICD-10-CM

## 2016-03-28 MED ORDER — PROCHLORPERAZINE MALEATE 10 MG PO TABS
10.0000 mg | ORAL_TABLET | Freq: Once | ORAL | Status: AC
  Administered 2016-03-28: 10 mg via ORAL

## 2016-03-28 MED ORDER — DECITABINE CHEMO INJECTION 50 MG
20.0000 mg/m2 | Freq: Once | INTRAVENOUS | Status: AC
  Administered 2016-03-28: 50 mg via INTRAVENOUS
  Filled 2016-03-28: qty 10

## 2016-03-28 MED ORDER — PROCHLORPERAZINE MALEATE 10 MG PO TABS
ORAL_TABLET | ORAL | Status: AC
  Filled 2016-03-28: qty 1

## 2016-03-28 MED ORDER — SODIUM CHLORIDE 0.9 % IV SOLN
Freq: Once | INTRAVENOUS | Status: AC
  Administered 2016-03-28: 09:00:00 via INTRAVENOUS

## 2016-03-28 NOTE — Patient Instructions (Signed)
Decitabine injection for infusion What is this medicine? DECITABINE (dee SYE ta been) is a chemotherapy drug. This medicine reduces the growth of cancer cells. It is used to treat adults with myelodysplastic syndromes. This medicine may be used for other purposes; ask your health care provider or pharmacist if you have questions. What should I tell my health care provider before I take this medicine? They need to know if you have any of these conditions: -infection (especially a virus infection such as chickenpox, cold sores, or herpes) -kidney disease -liver disease -an unusual or allergic reaction to decitabine, other medicines, foods, dyes, or preservatives -pregnant or trying to get pregnant -breast-feeding How should I use this medicine? This medicine is for infusion into a vein. It is administered in a hospital or clinic by a doctor or health care professional. Talk to your pediatrician regarding the use of this medicine in children. Special care may be needed. Overdosage: If you think you have taken too much of this medicine contact a poison control center or emergency room at once. NOTE: This medicine is only for you. Do not share this medicine with others. What if I miss a dose? It is important not to miss your dose. Call your doctor or health care professional if you are unable to keep an appointment. What may interact with this medicine? -vaccines Talk to your doctor or health care professional before taking any of these medicines: -aspirin -acetaminophen -ibuprofen -ketoprofen -naproxen This list may not describe all possible interactions. Give your health care provider a list of all the medicines, herbs, non-prescription drugs, or dietary supplements you use. Also tell them if you smoke, drink alcohol, or use illegal drugs. Some items may interact with your medicine. What should I watch for while using this medicine? Visit your doctor for checks on your progress. This drug  may make you feel generally unwell. This is not uncommon, as chemotherapy can affect healthy cells as well as cancer cells. Report any side effects. Continue your course of treatment even though you feel ill unless your doctor tells you to stop. In some cases, you may be given additional medicines to help with side effects. Follow all directions for their use. Call your doctor or health care professional for advice if you get a fever, chills or sore throat, or other symptoms of a cold or flu. Do not treat yourself. This drug decreases your body's ability to fight infections. Try to avoid being around people who are sick. This medicine may increase your risk to bruise or bleed. Call your doctor or health care professional if you notice any unusual bleeding. Do not become pregnant while taking this medicine or for at least 1 month after stopping it. Women should inform their doctor if they wish to become pregnant or think they might be pregnant. Men should not father a child while taking this medicine and for at least 2 months after stopping it. There is a potential for serious side effects to an unborn child. Talk to your health care professional or pharmacist for more information. Do not breast-feed an infant while taking this medicine. What side effects may I notice from receiving this medicine? Side effects that you should report to your doctor or health care professional as soon as possible: -low blood counts - this medicine may decrease the number of white blood cells, red blood cells and platelets. You may be at increased risk for infections and bleeding. -signs of infection - fever or chills, cough, sore throat,  pain or difficulty passing urine -signs of decreased platelets or bleeding - bruising, pinpoint red spots on the skin, black, tarry stools, blood in the urine -signs of decreased red blood cells - unusual weakness or tiredness, fainting spells, lightheadedness -increased blood sugar Side  effects that usually do not require medical attention (report to your prescriber or health care professional if they continue or are bothersome): -constipation -diarrhea -headache -loss of appetite -nausea, vomiting -skin rash, itching -stomach pain -water retention -weak or tired This list may not describe all possible side effects. Call your doctor for medical advice about side effects. You may report side effects to FDA at 1-800-FDA-1088. Where should I keep my medicine? This drug is given in a hospital or clinic and will not be stored at home. NOTE: This sheet is a summary. It may not cover all possible information. If you have questions about this medicine, talk to your doctor, pharmacist, or health care provider.    2016, [employer]vier/[employer] Standard. (2015-04-17 12:56:02)

## 2016-03-31 ENCOUNTER — Other Ambulatory Visit: Payer: Commercial Managed Care - HMO

## 2016-04-07 ENCOUNTER — Other Ambulatory Visit (HOSPITAL_BASED_OUTPATIENT_CLINIC_OR_DEPARTMENT_OTHER): Payer: Commercial Managed Care - HMO

## 2016-04-07 DIAGNOSIS — R519 Headache, unspecified: Secondary | ICD-10-CM

## 2016-04-07 DIAGNOSIS — D473 Essential (hemorrhagic) thrombocythemia: Secondary | ICD-10-CM

## 2016-04-07 DIAGNOSIS — D47Z9 Other specified neoplasms of uncertain behavior of lymphoid, hematopoietic and related tissue: Secondary | ICD-10-CM

## 2016-04-07 DIAGNOSIS — D469 Myelodysplastic syndrome, unspecified: Secondary | ICD-10-CM | POA: Diagnosis not present

## 2016-04-07 DIAGNOSIS — R51 Headache: Principal | ICD-10-CM

## 2016-04-07 LAB — CMP (CANCER CENTER ONLY)
ALBUMIN: 3.5 g/dL (ref 3.3–5.5)
ALK PHOS: 49 U/L (ref 26–84)
ALT: 23 U/L (ref 10–47)
AST: 23 U/L (ref 11–38)
BUN: 13 mg/dL (ref 7–22)
CO2: 29 mEq/L (ref 18–33)
CREATININE: 0.8 mg/dL (ref 0.6–1.2)
Calcium: 8.8 mg/dL (ref 8.0–10.3)
Chloride: 100 mEq/L (ref 98–108)
Glucose, Bld: 119 mg/dL — ABNORMAL HIGH (ref 73–118)
POTASSIUM: 3.9 meq/L (ref 3.3–4.7)
SODIUM: 136 meq/L (ref 128–145)
TOTAL PROTEIN: 6.4 g/dL (ref 6.4–8.1)
Total Bilirubin: 0.8 mg/dl (ref 0.20–1.60)

## 2016-04-07 LAB — CBC WITH DIFFERENTIAL (CANCER CENTER ONLY)
BASO#: 0 10*3/uL (ref 0.0–0.2)
BASO%: 0.4 % (ref 0.0–2.0)
EOS ABS: 0 10*3/uL (ref 0.0–0.5)
EOS%: 0 % (ref 0.0–7.0)
HCT: 37 % — ABNORMAL LOW (ref 38.7–49.9)
HEMOGLOBIN: 12.7 g/dL — AB (ref 13.0–17.1)
LYMPH#: 0.7 10*3/uL — ABNORMAL LOW (ref 0.9–3.3)
LYMPH%: 31.8 % (ref 14.0–48.0)
MCH: 37.1 pg — AB (ref 28.0–33.4)
MCHC: 34.3 g/dL (ref 32.0–35.9)
MCV: 108 fL — AB (ref 82–98)
MONO#: 0.4 10*3/uL (ref 0.1–0.9)
MONO%: 15.9 % — ABNORMAL HIGH (ref 0.0–13.0)
NEUT%: 51.9 % (ref 40.0–80.0)
NEUTROS ABS: 1.2 10*3/uL — AB (ref 1.5–6.5)
PLATELETS: 567 10*3/uL — AB (ref 145–400)
RBC: 3.42 10*6/uL — AB (ref 4.20–5.70)
RDW: 20.7 % — ABNORMAL HIGH (ref 11.1–15.7)
WBC: 2.3 10*3/uL — AB (ref 4.0–10.0)

## 2016-04-14 ENCOUNTER — Ambulatory Visit: Payer: Commercial Managed Care - HMO

## 2016-04-14 ENCOUNTER — Other Ambulatory Visit: Payer: Commercial Managed Care - HMO

## 2016-04-14 ENCOUNTER — Ambulatory Visit: Payer: Commercial Managed Care - HMO | Admitting: Hematology & Oncology

## 2016-04-15 ENCOUNTER — Ambulatory Visit: Payer: Commercial Managed Care - HMO

## 2016-04-16 ENCOUNTER — Ambulatory Visit: Payer: Commercial Managed Care - HMO

## 2016-04-17 ENCOUNTER — Ambulatory Visit: Payer: Commercial Managed Care - HMO

## 2016-04-18 ENCOUNTER — Ambulatory Visit: Payer: Commercial Managed Care - HMO

## 2016-04-21 ENCOUNTER — Other Ambulatory Visit: Payer: Commercial Managed Care - HMO

## 2016-04-21 ENCOUNTER — Ambulatory Visit: Payer: Commercial Managed Care - HMO

## 2016-04-21 ENCOUNTER — Ambulatory Visit: Payer: Commercial Managed Care - HMO | Admitting: Hematology & Oncology

## 2016-04-22 ENCOUNTER — Ambulatory Visit: Payer: Commercial Managed Care - HMO

## 2016-04-23 ENCOUNTER — Ambulatory Visit: Payer: Commercial Managed Care - HMO

## 2016-04-24 ENCOUNTER — Ambulatory Visit: Payer: Commercial Managed Care - HMO

## 2016-04-25 ENCOUNTER — Ambulatory Visit: Payer: Commercial Managed Care - HMO

## 2016-04-28 ENCOUNTER — Other Ambulatory Visit: Payer: Commercial Managed Care - HMO

## 2016-05-05 ENCOUNTER — Other Ambulatory Visit: Payer: Commercial Managed Care - HMO

## 2016-05-15 DIAGNOSIS — Z9481 Bone marrow transplant status: Secondary | ICD-10-CM | POA: Insufficient documentation

## 2016-05-16 DIAGNOSIS — Z5181 Encounter for therapeutic drug level monitoring: Secondary | ICD-10-CM | POA: Insufficient documentation

## 2016-06-11 DIAGNOSIS — I82629 Acute embolism and thrombosis of deep veins of unspecified upper extremity: Secondary | ICD-10-CM | POA: Insufficient documentation

## 2016-06-11 DIAGNOSIS — B259 Cytomegaloviral disease, unspecified: Secondary | ICD-10-CM | POA: Insufficient documentation

## 2016-07-31 ENCOUNTER — Telehealth: Payer: Self-pay | Admitting: *Deleted

## 2016-07-31 NOTE — Telephone Encounter (Signed)
[facility] providing office with update post donor BMT.  Patient is currently 90d out from transplant and is doing well. He will be discharged to home today. He has no central venous access. Minimal complications with one occurrence of febrile neutropenia, and CMV which has resolved. He did develop a DVT which is being treated with lovenox. His bone marrow shows >98% donor engraftment. Counts yesterday are WBC 2.7 and platelet of 128.   Dr Marin Olp is aware of update. They will send documentation via fax later today.

## 2016-08-06 ENCOUNTER — Ambulatory Visit (HOSPITAL_BASED_OUTPATIENT_CLINIC_OR_DEPARTMENT_OTHER)
Admission: RE | Admit: 2016-08-06 | Discharge: 2016-08-06 | Disposition: A | Discharge status: Home / Self Care | Payer: Commercial Managed Care - HMO | Source: Ambulatory Visit | Attending: Hematology & Oncology | Admitting: Hematology & Oncology

## 2016-08-06 ENCOUNTER — Encounter: Payer: Self-pay | Admitting: Hematology & Oncology

## 2016-08-06 ENCOUNTER — Other Ambulatory Visit (HOSPITAL_BASED_OUTPATIENT_CLINIC_OR_DEPARTMENT_OTHER): Payer: Commercial Managed Care - HMO

## 2016-08-06 ENCOUNTER — Ambulatory Visit (HOSPITAL_BASED_OUTPATIENT_CLINIC_OR_DEPARTMENT_OTHER): Payer: Commercial Managed Care - HMO | Admitting: Hematology & Oncology

## 2016-08-06 VITALS — BP 143/86 | HR 87 | Temp 98.1°F | Resp 16 | Ht 73.0 in | Wt 253.0 lb

## 2016-08-06 DIAGNOSIS — D469 Myelodysplastic syndrome, unspecified: Secondary | ICD-10-CM

## 2016-08-06 DIAGNOSIS — D47Z9 Other specified neoplasms of uncertain behavior of lymphoid, hematopoietic and related tissue: Secondary | ICD-10-CM

## 2016-08-06 DIAGNOSIS — R51 Headache: Principal | ICD-10-CM

## 2016-08-06 DIAGNOSIS — R0789 Other chest pain: Secondary | ICD-10-CM | POA: Diagnosis not present

## 2016-08-06 DIAGNOSIS — R918 Other nonspecific abnormal finding of lung field: Secondary | ICD-10-CM | POA: Insufficient documentation

## 2016-08-06 DIAGNOSIS — D473 Essential (hemorrhagic) thrombocythemia: Secondary | ICD-10-CM

## 2016-08-06 DIAGNOSIS — R519 Headache, unspecified: Secondary | ICD-10-CM

## 2016-08-06 LAB — CBC WITH DIFFERENTIAL (CANCER CENTER ONLY)
BASO#: 0 10*3/uL (ref 0.0–0.2)
BASO%: 0.2 % (ref 0.0–2.0)
EOS ABS: 0.1 10*3/uL (ref 0.0–0.5)
EOS%: 1.9 % (ref 0.0–7.0)
HCT: 35.8 % — ABNORMAL LOW (ref 38.7–49.9)
HEMOGLOBIN: 12.4 g/dL — AB (ref 13.0–17.1)
LYMPH#: 1.4 10*3/uL (ref 0.9–3.3)
LYMPH%: 33.3 % (ref 14.0–48.0)
MCH: 30.9 pg (ref 28.0–33.4)
MCHC: 34.6 g/dL (ref 32.0–35.9)
MCV: 89 fL (ref 82–98)
MONO#: 0.9 10*3/uL (ref 0.1–0.9)
MONO%: 21.1 % — AB (ref 0.0–13.0)
NEUT%: 43.5 % (ref 40.0–80.0)
NEUTROS ABS: 1.8 10*3/uL (ref 1.5–6.5)
Platelets: 128 10*3/uL — ABNORMAL LOW (ref 145–400)
RBC: 4.01 10*6/uL — ABNORMAL LOW (ref 4.20–5.70)
RDW: 13.9 % (ref 11.1–15.7)
WBC: 4.1 10*3/uL (ref 4.0–10.0)

## 2016-08-06 LAB — COMPREHENSIVE METABOLIC PANEL (CC13)
A/G RATIO: 1.9 (ref 1.2–2.2)
ALBUMIN: 4.1 g/dL (ref 3.5–5.5)
ALK PHOS: 75 IU/L (ref 39–117)
ALT: 18 IU/L (ref 0–44)
AST (SGOT): 18 IU/L (ref 0–40)
BILIRUBIN TOTAL: 0.2 mg/dL (ref 0.0–1.2)
BUN / CREAT RATIO: 12 (ref 9–20)
BUN: 18 mg/dL (ref 6–24)
CHLORIDE: 106 mmol/L (ref 96–106)
Calcium, Ser: 9.1 mg/dL (ref 8.7–10.2)
Carbon Dioxide, Total: 24 mmol/L (ref 18–29)
Creatinine, Ser: 1.48 mg/dL — ABNORMAL HIGH (ref 0.76–1.27)
GFR calc non Af Amer: 56 mL/min/{1.73_m2} — ABNORMAL LOW (ref >59)
GFR, EST AFRICAN AMERICAN: 64 mL/min/{1.73_m2} (ref >59)
GLOBULIN, TOTAL: 2.2 g/dL (ref 1.5–4.5)
GLUCOSE: 133 mg/dL — AB (ref 65–99)
POTASSIUM: 5.4 mmol/L — AB (ref 3.5–5.2)
SODIUM: 137 mmol/L (ref 134–144)
TOTAL PROTEIN: 6.3 g/dL (ref 6.0–8.5)

## 2016-08-06 MED ORDER — TRAMADOL HCL 50 MG PO TABS: 50.0000 mg | ORAL_TABLET | Freq: Four times a day (QID) | ORAL | 1 refills | Status: DC | PRN

## 2016-08-07 ENCOUNTER — Ambulatory Visit: Payer: Commercial Managed Care - HMO | Admitting: Hematology & Oncology

## 2016-08-07 ENCOUNTER — Other Ambulatory Visit: Payer: Commercial Managed Care - HMO

## 2016-08-08 ENCOUNTER — Other Ambulatory Visit (HOSPITAL_BASED_OUTPATIENT_CLINIC_OR_DEPARTMENT_OTHER): Payer: Commercial Managed Care - HMO

## 2016-08-08 DIAGNOSIS — R0789 Other chest pain: Secondary | ICD-10-CM

## 2016-08-08 DIAGNOSIS — D47Z9 Other specified neoplasms of uncertain behavior of lymphoid, hematopoietic and related tissue: Secondary | ICD-10-CM

## 2016-08-08 DIAGNOSIS — D469 Myelodysplastic syndrome, unspecified: Secondary | ICD-10-CM

## 2016-08-08 LAB — CBC WITH DIFFERENTIAL (CANCER CENTER ONLY)
BASO#: 0 10*3/uL (ref 0.0–0.2)
BASO%: 0.3 % (ref 0.0–2.0)
EOS ABS: 0.2 10*3/uL (ref 0.0–0.5)
EOS%: 5.6 % (ref 0.0–7.0)
HEMATOCRIT: 37.1 % — AB (ref 38.7–49.9)
HEMOGLOBIN: 12.7 g/dL — AB (ref 13.0–17.1)
LYMPH#: 1.3 10*3/uL (ref 0.9–3.3)
LYMPH%: 32.8 % (ref 14.0–48.0)
MCH: 30.8 pg (ref 28.0–33.4)
MCHC: 34.2 g/dL (ref 32.0–35.9)
MCV: 90 fL (ref 82–98)
MONO#: 0.7 10*3/uL (ref 0.1–0.9)
MONO%: 17.9 % — ABNORMAL HIGH (ref 0.0–13.0)
NEUT#: 1.7 10*3/uL (ref 1.5–6.5)
NEUT%: 43.4 % (ref 40.0–80.0)
Platelets: 126 10*3/uL — ABNORMAL LOW (ref 145–400)
RBC: 4.12 10*6/uL — AB (ref 4.20–5.70)
RDW: 14 % (ref 11.1–15.7)
WBC: 4 10*3/uL (ref 4.0–10.0)

## 2016-08-08 LAB — CMP (CANCER CENTER ONLY)
ALBUMIN: 3.5 g/dL (ref 3.3–5.5)
ALK PHOS: 71 U/L (ref 26–84)
ALT: 30 U/L (ref 10–47)
AST: 25 U/L (ref 11–38)
BILIRUBIN TOTAL: 0.5 mg/dL (ref 0.20–1.60)
BUN, Bld: 16 mg/dL (ref 7–22)
CO2: 25 meq/L (ref 18–33)
CREATININE: 1.4 mg/dL — AB (ref 0.6–1.2)
Calcium: 9.4 mg/dL (ref 8.0–10.3)
Chloride: 107 mEq/L (ref 98–108)
Glucose, Bld: 166 mg/dL — ABNORMAL HIGH (ref 73–118)
Potassium: 4.9 mEq/L — ABNORMAL HIGH (ref 3.3–4.7)
SODIUM: 141 meq/L (ref 128–145)
TOTAL PROTEIN: 6.3 g/dL — AB (ref 6.4–8.1)

## 2016-08-08 LAB — CHCC SATELLITE - SMEAR

## 2016-08-08 LAB — MAGNESIUM: Magnesium: 1.5 mg/dl (ref 1.5–2.5)

## 2016-08-08 NOTE — Progress Notes (Signed)
Hematology and Oncology Follow Up Visit  NELSE HOAGUE [id number] 05-Feb-1971 45 y.o. 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 Yoho is back for a long awaited follow-up. We got him to [facility] for a stem cell transplant., Even at [facility], there were not sure as to what he had. He has a "hybrid" bone marrow disorder. He did not respond at all to systemic chemotherapy. He subsequently got and on related matched donor. This was at [facility]. His conditioning regimen was Bu/Cy/ATG.  He had a very tough time. He did have a DVT in the right internal jugular vein. He currently is on Lovenox. Hopefully, he will finish up the Lovenox in a month or so.  He also had CMV positivity. He did not have any actual CMV infection. He is on antibiotics for this.  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 He is on tacrolimus for this.  His last bone marrow test was done on October 31. He engrafted nicely. The cytogenetic studies are not back yet.  His blood sugars have been pretty fluctuant. Due to, as always, did a great job in trying to manage this.  He actually looks quite good. He does have some tremors area and this might be from the tacrolimus. We are checking his blood work.  He's had no obvious bleeding. He does have some bruising from the Lovenox injections.  There is no diarrhea. He's had no leg swelling. He's had no cough or increased shortness of breath.  He's lost about 40 pounds.  As always, his wife is in a credible source of strength. She has been with him every step of the way.  Currently, his performance status is ECOG 1.  Medications:  Current Outpatient Prescriptions:  .  acyclovir (ZOVIRAX) 400 MG tablet, Take 400 mg by mouth 2 (two) times daily., Disp: , Rfl:  .  amLODipine (NORVASC) 5 MG tablet, Take 5 mg  by mouth daily., Disp: , Rfl:  .  clobetasol cream (TEMOVATE) AB-123456789 %, Apply 1 application topically 2 (two) times daily as needed., Disp: , Rfl:  .  hydrocortisone cream 1 %, Apply 1 application topically 2 (two) times daily as needed for itching., Disp: , Rfl:  .  LORazepam (ATIVAN) 1 MG tablet, Take 1 mg by mouth every 4 (four) hours as needed for anxiety., Disp: , Rfl:  .  magnesium oxide (MAG-OX) 400 MG tablet, Take 800 mg by mouth 2 (two) times daily., Disp: , Rfl:  .  oxycodone (OXY-IR) 5 MG capsule, Take 5 mg by mouth every 3 (three) hours as needed., Disp: , Rfl:  .  posaconazole (NOXAFIL) 100 MG TBEC delayed-release tablet, Take 300 mg by mouth 3 (three) times daily., Disp: , Rfl:  .  prochlorperazine (COMPAZINE) 10 MG tablet, Take 10 mg by mouth every 6 (six) hours as needed for nausea or vomiting., Disp: , Rfl:  .  sulfamethoxazole-trimethoprim (BACTRIM,SEPTRA) 400-80 MG tablet, Take 1 tablet by mouth daily., Disp: , Rfl:  .  tacrolimus (PROGRAF) 0.5 MG capsule, Take 0.5 mg by mouth 2 (two) times daily. Take 3 capsules in AM and 2 capsules in PM, Disp: , Rfl:  .  ursodiol (ACTIGALL) 300 MG capsule, Take 300 mg by mouth 3 (three) times daily., Disp: , Rfl:  .  cetirizine (  ZYRTEC) 10 MG tablet, Take by mouth., Disp: , Rfl:  .  dexlansoprazole (DEXILANT) 60 MG capsule, Take by mouth., Disp: , Rfl:  .  enoxaparin (LOVENOX) 150 MG/ML injection, Inject 120 mg into the skin., Disp: , Rfl:  .  insulin aspart (NOVOLOG) 100 UNIT/ML injection, Inject into the skin., Disp: , Rfl:  .  ondansetron (ZOFRAN) 8 MG tablet, Take 1 tablet (8 mg total) by mouth every 8 (eight) hours as needed for nausea or vomiting., Disp: 30 tablet, Rfl: 2 .  ONE TOUCH ULTRA TEST test strip, USE AS DIRECTED. TESTING FREQUENCY: 2-4 TIMES DAILY., Disp: , Rfl: 11 .  TOUJEO SOLOSTAR 300 UNIT/ML SOPN, TAKE 40 UNITS BEFORE BREAKFAST DAILY, Disp: , Rfl: 12 .  traMADol (ULTRAM) 50 MG tablet, Take 1 tablet (50 mg total) by mouth  every 6 (six) hours as needed., Disp: 60 tablet, Rfl: 1  Allergies: No Known Allergies  Past Medical History, Surgical history, Social history, and Family History were reviewed and updated.  Review of Systems:  As above  Physical Exam:  height is 6\' 1"  (1.854 m) and weight is 253 lb (114.8 kg). His oral temperature is 98.1 F (36.7 C). His blood pressure is 143/86 (abnormal) and his pulse is 87. His respiration is 16.   Wt Readings from Last 3 Encounters:  08/06/16 253 lb (114.8 kg)  03/24/16 275 lb (124.7 kg)  02/18/16 275 lb (124.7 kg)     Well-developed well-nourished white male in no obvious distress. Head exam shows total alopecia. He has no ocular or oral lesions. There is no scleral icterus. He has no mucositis. There is no adenopathy in the neck. Thyroid is non-palpable. Lungs are clear to percussion and ascultation bilaterally. Cardiac exam regular rate and rhythm with no murmurs, rubs or bruits. Abdomen is soft. There are no abdominal masses. He has no guarding or rebound tenderness. Has no palpable liver or spleen tip. Back exam shows no tenderness over the spine, ribs or hips. Extremities shows no clubbing, cyanosis or edema. Neurological exam shows no focal neurological dose. He does have a limit of a tremor. Skin exam shows some ecchymoses on his abdomen from the Lovenox injections. 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  Lab Results  Component Value Date   WBC 4.1 08/06/2016   HGB 12.4 (L) 08/06/2016   HCT 35.8 (L) 08/06/2016   MCV 89 08/06/2016   PLT 128 (L) 08/06/2016     Chemistry      Component Value Date/Time   NA 137 08/06/2016 1517   NA 136 04/07/2016 1207   NA 136 03/03/2016 1147   K 5.4 (H) 08/06/2016 1517   K 3.9 04/07/2016 1207   K 4.2 03/03/2016 1147   CL 106 08/06/2016 1517   CL 100 04/07/2016 1207   CO2 24 08/06/2016 1517   CO2 29 04/07/2016 1207   CO2 27 03/03/2016 1147   BUN 18 08/06/2016 1517   BUN 13 04/07/2016 1207   BUN  14.4 03/03/2016 1147   CREATININE 1.48 (H) 08/06/2016 1517   CREATININE 0.8 04/07/2016 1207   CREATININE 0.9 03/03/2016 1147      Component Value Date/Time   CALCIUM 9.1 08/06/2016 1517   CALCIUM 8.8 04/07/2016 1207   CALCIUM 9.0 03/03/2016 1147   ALKPHOS 75 08/06/2016 1517   ALKPHOS 49 04/07/2016 1207   ALKPHOS 58 03/03/2016 1147   AST 18 08/06/2016 1517   AST 23 04/07/2016 1207   AST 15  03/03/2016 1147   ALT 18 08/06/2016 1517   ALT 23 04/07/2016 1207   ALT 19 03/03/2016 1147   BILITOT 0.2 08/06/2016 1517   BILITOT 0.80 04/07/2016 1207   BILITOT 0.51 03/03/2016 1147         Impression and Plan: Mr. Panno is A 45 year old white male. 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  His platelet count is normal. His bone marrow done at [facility] showed engraftment. I will like to think that the chromosome anomaly is gone.   We will have to follow his counts relentlessly. We'll check his labs weekly according to [facility]. I want to try to make it easier for him that he can just come to our office for an evaluation and labs. I know that he has to go out to [facility] for an evaluation every couple weeks.  I'm just glad that he did as well as he did with the transplant. This was a incredibly difficult procedure and he got through it because he is incredible strong and has a lot of support.  I spent about an hour with he and his wife. It was nice to see him again, I'll be it with a little less weight.   [name], MD 11/10/20178:19 AM

## 2016-08-11 ENCOUNTER — Other Ambulatory Visit: Payer: Self-pay | Admitting: *Deleted

## 2016-08-11 DIAGNOSIS — D75839 Thrombocytosis, unspecified: Secondary | ICD-10-CM

## 2016-08-11 DIAGNOSIS — D469 Myelodysplastic syndrome, unspecified: Secondary | ICD-10-CM

## 2016-08-11 DIAGNOSIS — D473 Essential (hemorrhagic) thrombocythemia: Secondary | ICD-10-CM

## 2016-08-12 ENCOUNTER — Other Ambulatory Visit (HOSPITAL_BASED_OUTPATIENT_CLINIC_OR_DEPARTMENT_OTHER): Payer: Commercial Managed Care - HMO

## 2016-08-12 ENCOUNTER — Ambulatory Visit (HOSPITAL_BASED_OUTPATIENT_CLINIC_OR_DEPARTMENT_OTHER): Payer: Commercial Managed Care - HMO | Admitting: Hematology & Oncology

## 2016-08-12 VITALS — BP 134/86 | HR 83 | Temp 97.8°F | Ht 73.0 in | Wt 250.0 lb

## 2016-08-12 DIAGNOSIS — D469 Myelodysplastic syndrome, unspecified: Secondary | ICD-10-CM | POA: Diagnosis not present

## 2016-08-12 DIAGNOSIS — D89813 Graft-versus-host disease, unspecified: Secondary | ICD-10-CM | POA: Diagnosis not present

## 2016-08-12 DIAGNOSIS — Z86718 Personal history of other venous thrombosis and embolism: Secondary | ICD-10-CM | POA: Diagnosis not present

## 2016-08-12 DIAGNOSIS — B259 Cytomegaloviral disease, unspecified: Secondary | ICD-10-CM

## 2016-08-12 DIAGNOSIS — D75839 Thrombocytosis, unspecified: Secondary | ICD-10-CM

## 2016-08-12 DIAGNOSIS — D473 Essential (hemorrhagic) thrombocythemia: Secondary | ICD-10-CM

## 2016-08-12 DIAGNOSIS — T8603 Bone marrow transplant infection: Secondary | ICD-10-CM

## 2016-08-12 LAB — CBC WITH DIFFERENTIAL (CANCER CENTER ONLY)
BASO#: 0 10*3/uL (ref 0.0–0.2)
BASO%: 0.2 % (ref 0.0–2.0)
EOS%: 3.4 % (ref 0.0–7.0)
Eosinophils Absolute: 0.2 10*3/uL (ref 0.0–0.5)
HCT: 37.7 % — ABNORMAL LOW (ref 38.7–49.9)
HGB: 13 g/dL (ref 13.0–17.1)
LYMPH#: 1.3 10*3/uL (ref 0.9–3.3)
LYMPH%: 29.9 % (ref 14.0–48.0)
MCH: 30.8 pg (ref 28.0–33.4)
MCHC: 34.5 g/dL (ref 32.0–35.9)
MCV: 89 fL (ref 82–98)
MONO#: 0.6 10*3/uL (ref 0.1–0.9)
MONO%: 13.3 % — AB (ref 0.0–13.0)
NEUT#: 2.3 10*3/uL (ref 1.5–6.5)
NEUT%: 53.2 % (ref 40.0–80.0)
PLATELETS: 124 10*3/uL — AB (ref 145–400)
RBC: 4.22 10*6/uL (ref 4.20–5.70)
RDW: 13.8 % (ref 11.1–15.7)
WBC: 4.4 10*3/uL (ref 4.0–10.0)

## 2016-08-12 LAB — CMP (CANCER CENTER ONLY)
ALK PHOS: 81 U/L (ref 26–84)
ALT: 24 U/L (ref 10–47)
AST: 26 U/L (ref 11–38)
Albumin: 3.7 g/dL (ref 3.3–5.5)
BILIRUBIN TOTAL: 0.5 mg/dL (ref 0.20–1.60)
BUN: 17 mg/dL (ref 7–22)
CO2: 25 meq/L (ref 18–33)
Calcium: 9.7 mg/dL (ref 8.0–10.3)
Chloride: 107 mEq/L (ref 98–108)
Creat: 1.5 mg/dl — ABNORMAL HIGH (ref 0.6–1.2)
GLUCOSE: 124 mg/dL — AB (ref 73–118)
Potassium: 4.7 mEq/L (ref 3.3–4.7)
SODIUM: 143 meq/L (ref 128–145)
Total Protein: 6.7 g/dL (ref 6.4–8.1)

## 2016-08-12 LAB — MAGNESIUM: Magnesium: 1.6 mg/dl (ref 1.5–2.5)

## 2016-08-13 NOTE — Progress Notes (Signed)
Hematology and Oncology Follow Up Visit  AVERELL Blake [id number] 10-09-1970 45 y.o. 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 We saw him last week after he had been at [facility] for about 3 months. He looked pretty good. We are trying to follow him closely.  We did a chest x-ray on him because of the chest wall pain. This was negative for any obvious fracture. I told him that we could do a CT scan of the chest if he was having more problems with chest wall pain. He would like to hold off on this if he could. I think this would be reasonable.  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 He uses topical steroid for this.  He's not as jittery.   He had a very tough time. He did have a DVT in the right internal jugular vein. He currently is on Lovenox. Hopefully, he will finish up the Lovenox in a month or so.  He also had CMV positivity. He did not have any actual CMV infection. He is on antibiotics for this. We will recheck this today.  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 He is on tacrolimus for this. His renal function will not be watched.   His last bone marrow test was done on October 31. He engrafted nicely. The cytogenetic studies are not back yet.  His blood sugars have been pretty fluctuant. Due to, as always, did a great job in trying to manage this.  He actually looks quite good. He does have some tremors area and this might be from the tacrolimus. We are checking his blood work.  He's had no obvious bleeding. He does have some bruising from the Lovenox injections. Hopefully, he will be over stop the Lovenox neck small.  There is no diarrhea. He's had no leg swelling. He's had no cough or increased shortness of breath.  He's lost about 40  pounds.  As always, his wife is in an incredible source of strength. She has been with him every step of the way.  Currently, his performance status is ECOG 1.  Medications:  Current Outpatient Prescriptions:  .  acyclovir (ZOVIRAX) 400 MG tablet, Take 400 mg by mouth 2 (two) times daily., Disp: , Rfl:  .  amLODipine (NORVASC) 5 MG tablet, Take 5 mg by mouth daily., Disp: , Rfl:  .  cetirizine (ZYRTEC) 10 MG tablet, Take by mouth., Disp: , Rfl:  .  clobetasol cream (TEMOVATE) AB-123456789 %, Apply 1 application topically 2 (two) times daily as needed., Disp: , Rfl:  .  dexlansoprazole (DEXILANT) 60 MG capsule, Take by mouth., Disp: , Rfl:  .  enoxaparin (LOVENOX) 150 MG/ML injection, Inject 120 mg into the skin., Disp: , Rfl:  .  hydrocortisone cream 1 %, Apply 1 application topically 2 (two) times daily as needed for itching., Disp: , Rfl:  .  insulin aspart (NOVOLOG) 100 UNIT/ML injection, Inject into the skin., Disp: , Rfl:  .  LORazepam (ATIVAN) 1 MG tablet, Take 1 mg by mouth every 4 (four) hours as needed for anxiety., Disp: , Rfl:  .  magnesium oxide (MAG-OX) 400 MG tablet, Take 800 mg by mouth 2 (two) times daily., Disp: , Rfl:  .  ondansetron (ZOFRAN) 8 MG tablet, Take 1 tablet (8 mg total) by mouth every 8 (eight) hours as needed for nausea or vomiting., Disp: 30 tablet, Rfl: 2 .  ONE TOUCH ULTRA TEST test strip, USE AS DIRECTED. TESTING FREQUENCY: 2-4 TIMES DAILY., Disp: , Rfl: 11 .  oxycodone (OXY-IR) 5 MG capsule, Take 5 mg by mouth every 3 (three) hours as needed., Disp: , Rfl:  .  posaconazole (NOXAFIL) 100 MG TBEC delayed-release tablet, Take 300 mg by mouth 3 (three) times daily., Disp: , Rfl:  .  sulfamethoxazole-trimethoprim (BACTRIM,SEPTRA) 400-80 MG tablet, Take 1 tablet by mouth daily., Disp: , Rfl:  .  tacrolimus (PROGRAF) 0.5 MG capsule, Take 0.5 mg by mouth 2 (two) times daily. Take 3 capsules in AM and 2 capsules in PM, Disp: , Rfl:  .  TOUJEO SOLOSTAR 300 UNIT/ML SOPN, TAKE  40 UNITS BEFORE BREAKFAST DAILY, Disp: , Rfl: 12 .  traMADol (ULTRAM) 50 MG tablet, Take 1 tablet (50 mg total) by mouth every 6 (six) hours as needed., Disp: 60 tablet, Rfl: 1  Allergies: No Known Allergies  Past Medical History, Surgical history, Social history, and Family History were reviewed and updated.  Review of Systems:  As above  Physical Exam:  height is 6\' 1"  (1.854 m) and weight is 250 lb (113.4 kg). His oral temperature is 97.8 F (36.6 C). His blood pressure is 134/86 and his pulse is 83.   Wt Readings from Last 3 Encounters:  08/12/16 250 lb (113.4 kg)  08/06/16 253 lb (114.8 kg)  03/24/16 275 lb (124.7 kg)     Well-developed well-nourished white male in no obvious distress. Head exam shows total alopecia. He has no ocular or oral lesions. There is no scleral icterus. He has no mucositis. There is no adenopathy in the neck. Thyroid is non-palpable. Lungs are clear to percussion and ascultation bilaterally. Cardiac exam regular rate and rhythm with no murmurs, rubs or bruits. Abdomen is soft. There are no abdominal masses. He has no guarding or rebound tenderness. Has no palpable liver or spleen tip. Back exam shows no tenderness over the spine, ribs or hips. Extremities shows no clubbing, cyanosis or edema. Neurological exam shows no focal neurological dose. He does have a limit of a tremor. Skin exam shows some ecchymoses on his abdomen from the Lovenox injections. 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  Lab Results  Component Value Date   WBC 4.4 08/12/2016   HGB 13.0 08/12/2016   HCT 37.7 (L) 08/12/2016   MCV 89 08/12/2016   PLT 124 (L) 08/12/2016     Chemistry      Component Value Date/Time   NA 143 08/12/2016 0956   NA 136 03/03/2016 1147   K 4.7 08/12/2016 0956   K 4.2 03/03/2016 1147   CL 107 08/12/2016 0956   CO2 25 08/12/2016 0956   CO2 27 03/03/2016 1147   BUN 17 08/12/2016 0956   BUN 14.4 03/03/2016 1147   CREATININE 1.5 (H)  08/12/2016 0956   CREATININE 0.9 03/03/2016 1147      Component Value Date/Time   CALCIUM 9.7 08/12/2016 0956   CALCIUM 9.0 03/03/2016 1147   ALKPHOS 81 08/12/2016 0956   ALKPHOS 58 03/03/2016 1147   AST 26 08/12/2016 0956   AST 15 03/03/2016 1147   ALT 24 08/12/2016 0956   ALT 19 03/03/2016 1147   BILITOT 0.50 08/12/2016 0956   BILITOT 0.51 03/03/2016 1147  Impression and Plan: Mr. Mangiapane is A 45 year old white male. 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  His platelet count is normal. His bone marrow done at [facility] showed engraftment. I will like to think that the chromosome anomaly is gone.   We will have to follow his counts regularly. We'll check his labs weekly according to [facility]. I want to try to make it easier for him that he can just come to our office for an evaluation and labs. I know that he has to go out to [facility] for an evaluation every couple weeks.  I'm just glad that he did as well as he did with the transplant. This was a incredibly difficult procedure and he got through it because he is incredible strong and has a lot of support.  I spent about an hour with he and his wife. It was nice to see him again, I'll be it with a little less weight.   [name], MD 11/15/20177:07 AM

## 2016-08-14 LAB — CMV DNA, QUANTITATIVE, PCR: CMV DNA QUANT: NEGATIVE [IU]/mL

## 2016-08-14 LAB — TACROLIMUS LEVEL: TACROLIMUS LVL: 9.8 ng/mL (ref 2.0–20.0)

## 2016-08-18 ENCOUNTER — Encounter: Payer: Self-pay | Admitting: *Deleted

## 2016-08-25 ENCOUNTER — Ambulatory Visit: Payer: Commercial Managed Care - HMO | Admitting: Hematology & Oncology

## 2016-08-25 ENCOUNTER — Other Ambulatory Visit: Payer: Commercial Managed Care - HMO

## 2016-08-28 ENCOUNTER — Other Ambulatory Visit (HOSPITAL_BASED_OUTPATIENT_CLINIC_OR_DEPARTMENT_OTHER): Payer: Commercial Managed Care - HMO

## 2016-08-28 ENCOUNTER — Ambulatory Visit (HOSPITAL_BASED_OUTPATIENT_CLINIC_OR_DEPARTMENT_OTHER): Payer: Commercial Managed Care - HMO | Admitting: Hematology & Oncology

## 2016-08-28 DIAGNOSIS — D469 Myelodysplastic syndrome, unspecified: Secondary | ICD-10-CM

## 2016-08-28 DIAGNOSIS — T8603 Bone marrow transplant infection: Secondary | ICD-10-CM

## 2016-08-28 DIAGNOSIS — R0789 Other chest pain: Secondary | ICD-10-CM

## 2016-08-28 DIAGNOSIS — B259 Cytomegaloviral disease, unspecified: Secondary | ICD-10-CM

## 2016-08-28 LAB — CBC WITH DIFFERENTIAL (CANCER CENTER ONLY)
BASO#: 0 10*3/uL (ref 0.0–0.2)
BASO%: 0 % (ref 0.0–2.0)
EOS%: 3.7 % (ref 0.0–7.0)
Eosinophils Absolute: 0.2 10*3/uL (ref 0.0–0.5)
HCT: 38.1 % — ABNORMAL LOW (ref 38.7–49.9)
HGB: 13.1 g/dL (ref 13.0–17.1)
LYMPH#: 1.6 10*3/uL (ref 0.9–3.3)
LYMPH%: 38.5 % (ref 14.0–48.0)
MCH: 29.9 pg (ref 28.0–33.4)
MCHC: 34.4 g/dL (ref 32.0–35.9)
MCV: 87 fL (ref 82–98)
MONO#: 0.7 10*3/uL (ref 0.1–0.9)
MONO%: 16.9 % — ABNORMAL HIGH (ref 0.0–13.0)
NEUT#: 1.7 10*3/uL (ref 1.5–6.5)
NEUT%: 40.9 % (ref 40.0–80.0)
PLATELETS: 127 10*3/uL — AB (ref 145–400)
RBC: 4.38 10*6/uL (ref 4.20–5.70)
RDW: 13.7 % (ref 11.1–15.7)
WBC: 4 10*3/uL (ref 4.0–10.0)

## 2016-08-28 LAB — COMPREHENSIVE METABOLIC PANEL
ALT: 17 U/L (ref 0–55)
AST: 16 U/L (ref 5–34)
Albumin: 3.7 g/dL (ref 3.5–5.0)
Alkaline Phosphatase: 98 U/L (ref 40–150)
Anion Gap: 7 mEq/L (ref 3–11)
BUN: 23.1 mg/dL (ref 7.0–26.0)
CALCIUM: 9.4 mg/dL (ref 8.4–10.4)
CHLORIDE: 109 meq/L (ref 98–109)
CO2: 21 meq/L — AB (ref 22–29)
CREATININE: 1.3 mg/dL (ref 0.7–1.3)
EGFR: 66 mL/min/{1.73_m2} — ABNORMAL LOW (ref >90)
GLUCOSE: 88 mg/dL (ref 70–140)
POTASSIUM: 5 meq/L (ref 3.5–5.1)
SODIUM: 137 meq/L (ref 136–145)
Total Bilirubin: 0.3 mg/dL (ref 0.20–1.20)
Total Protein: 6.8 g/dL (ref 6.4–8.3)

## 2016-08-28 LAB — MAGNESIUM: MAGNESIUM: 2 mg/dL (ref 1.5–2.5)

## 2016-08-28 MED ORDER — ENOXAPARIN SODIUM 150 MG/ML ~~LOC~~ SOLN: 150.0000 mg | SUBCUTANEOUS | 0 refills | Status: DC

## 2016-08-28 NOTE — Progress Notes (Signed)
Hematology and Oncology Follow Up Visit  Patrick Blake [id number] December 05, 1970 45 y.o. 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 He looks good. He and his family were at the beach for [place]. According to his wife, he really did not joints of all that much.   He was seen at [facility] back on the 21st. He wishes that he'll be also on his medications. Apparently, the Lovenox will be stopped it about 2 weeks. This will make him quite happy.   He still has some tremors. This might be from the tacrolimus.   On his blood sugars have been doing pretty well. He now has a is [employer] pump on. Over this will give his blood sugars a little more consistent.   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   He is loose some weight area and his wife is little worried about this. I don't think this is that much of an issue right now given that he has been quite heavy and the weight loss certainly would not hurt him right now.   He's had no fever. He's had no cough or shortness of breath. He's had no mouth sores  I told that he possibly to his ophthalmologist in a couple months to see about his vision. I think with everything that's going on right now, I'm unsure that any changes with his vision would not change again.  As always, his wife is in an incredible source of strength. She has been with him every step of the way.  Currently, his performance status is ECOG 1.  Medications:  Current Outpatient Prescriptions:  .  acyclovir (ZOVIRAX) 400 MG tablet, Take 400 mg by mouth 2 (two) times daily., Disp: , Rfl:  .  amLODipine (NORVASC) 5 MG tablet, Take 5 mg by mouth daily., Disp: , Rfl:  .  cetirizine (ZYRTEC) 10 MG tablet, Take by mouth., Disp: , Rfl:  .  clobetasol cream  (TEMOVATE) AB-123456789 %, Apply 1 application topically 2 (two) times daily as needed., Disp: , Rfl:  .  dexlansoprazole (DEXILANT) 60 MG capsule, Take by mouth daily. , Disp: , Rfl:  .  enoxaparin (LOVENOX) 150 MG/ML injection, Inject 1 mL (150 mg total) into the skin daily., Disp: 10 Syringe, Rfl: 0 .  hydrocortisone cream 1 %, Apply 1 application topically 2 (two) times daily as needed for itching., Disp: , Rfl:  .  insulin aspart (NOVOLOG) 100 UNIT/ML injection, Inject into the skin. , Disp: , Rfl:  .  LORazepam (ATIVAN) 1 MG tablet, Take 1 mg by mouth every 4 (four) hours as needed for anxiety., Disp: , Rfl:  .  magnesium oxide (MAG-OX) 400 MG tablet, Take 800 mg by mouth 2 (two) times daily., Disp: , Rfl:  .  ondansetron (ZOFRAN) 8 MG tablet, Take 1 tablet (8 mg total) by mouth every 8 (eight) hours as needed for nausea or vomiting., Disp: 30 tablet, Rfl: 2 .  ONE TOUCH ULTRA TEST test strip, USE AS DIRECTED. TESTING FREQUENCY: 2-4 TIMES DAILY., Disp: , Rfl: 11 .  oxycodone (OXY-IR) 5 MG capsule, Take 5 mg by mouth every 3 (three) hours as needed., Disp: , Rfl:  .  posaconazole (NOXAFIL) 100 MG TBEC delayed-release tablet, Take 300 mg  by mouth daily. , Disp: , Rfl:  .  sulfamethoxazole-trimethoprim (BACTRIM,SEPTRA) 400-80 MG tablet, Take 1 tablet by mouth daily., Disp: , Rfl:  .  tacrolimus (PROGRAF) 0.5 MG capsule, Take 1 mg by mouth 2 (two) times daily. Take 2 capsules in AM and 2 capsules in PM, Disp: , Rfl:  .  TOUJEO SOLOSTAR 300 UNIT/ML SOPN, TAKE 44 UNITS BEFORE BREAKFAST DAILY, Disp: , Rfl: 12 .  traMADol (ULTRAM) 50 MG tablet, Take 1 tablet (50 mg total) by mouth every 6 (six) hours as needed., Disp: 60 tablet, Rfl: 1  Allergies: No Known Allergies  Past Medical History, Surgical history, Social history, and Family History were reviewed and updated.  Review of Systems:  As above  Physical Exam:  weight is 243 lb (110.2 kg). His oral temperature is 98.2 F (36.8 C). His blood pressure  is 138/82 and his pulse is 88. His respiration is 20.   Wt Readings from Last 3 Encounters:  08/28/16 243 lb (110.2 kg)  08/12/16 250 lb (113.4 kg)  08/06/16 253 lb (114.8 kg)     Well-developed well-nourished white male in no obvious distress. Head exam shows total alopecia. He has no ocular or oral lesions. There is no scleral icterus. He has no mucositis. There is no adenopathy in the neck. Thyroid is non-palpable. Lungs are clear to percussion and ascultation bilaterally. Cardiac exam regular rate and rhythm with no murmurs, rubs or bruits. Abdomen is soft. There are no abdominal masses. He has no guarding or rebound tenderness. Has no palpable liver or spleen tip. Back exam shows no tenderness over the spine, ribs or hips. Extremities shows no clubbing, cyanosis or edema. Neurological exam shows no focal neurological dose. He does have a limit of a tremor. Skin exam shows some ecchymoses on his abdomen from the Lovenox injections. 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  Lab Results  Component Value Date   WBC 4.0 08/28/2016   HGB 13.1 08/28/2016   HCT 38.1 (L) 08/28/2016   MCV 87 08/28/2016   PLT 127 (L) 08/28/2016     Chemistry      Component Value Date/Time   NA 137 08/28/2016 1105   K 5.0 08/28/2016 1105   CL 107 08/12/2016 0956   CO2 21 (L) 08/28/2016 1105   BUN 23.1 08/28/2016 1105   CREATININE 1.3 08/28/2016 1105      Component Value Date/Time   CALCIUM 9.4 08/28/2016 1105   ALKPHOS 98 08/28/2016 1105   AST 16 08/28/2016 1105   ALT 17 08/28/2016 1105   BILITOT 0.30 08/28/2016 1105         Impression and Plan: Patrick Blake is A 45 year old white male. 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  His platelet count is normal. His bone marrow done at [facility] showed engraftment. The chromosome studies are  normal.   We will have to follow his counts regularly. We'll check his labs weekly according to [facility]. I want to try to make it easier for him that he can just come to our office for an evaluation and labs. I know that he has to go out to [facility] for an evaluation every couple weeks.  I'm just glad that he did as well as he did with the transplant. This was a incredibly difficult procedure and he got through it because  he is incredible strong and has a lot of support.  I spent about 30 minutes with he and his wife. It was nice to see him again, I'll be it with a little less weight.   [name], MD 11/30/20176:20 PM

## 2016-08-30 LAB — TACROLIMUS LEVEL: TACROLIMUS LVL: 8.4 ng/mL (ref 2.0–20.0)

## 2016-09-02 ENCOUNTER — Telehealth: Payer: Self-pay

## 2016-09-02 LAB — CMV DNA, QUANTITATIVE, PCR: CMV Quant DNA PCR (Plasma): NEGATIVE IU/mL

## 2016-09-02 NOTE — Telephone Encounter (Signed)
Letitia Libra RN from [facility] called to request [facility] stops sending lab results to them. The pt has transferred his care to [facility]. Her number for any questions is [phone]

## 2016-09-04 ENCOUNTER — Other Ambulatory Visit (HOSPITAL_BASED_OUTPATIENT_CLINIC_OR_DEPARTMENT_OTHER): Payer: Commercial Managed Care - HMO | Admitting: *Deleted

## 2016-09-04 DIAGNOSIS — D469 Myelodysplastic syndrome, unspecified: Secondary | ICD-10-CM | POA: Diagnosis not present

## 2016-09-04 DIAGNOSIS — D47Z9 Other specified neoplasms of uncertain behavior of lymphoid, hematopoietic and related tissue: Secondary | ICD-10-CM | POA: Diagnosis not present

## 2016-09-04 DIAGNOSIS — R519 Headache, unspecified: Secondary | ICD-10-CM

## 2016-09-04 DIAGNOSIS — D473 Essential (hemorrhagic) thrombocythemia: Secondary | ICD-10-CM

## 2016-09-04 DIAGNOSIS — R51 Headache: Principal | ICD-10-CM

## 2016-09-04 LAB — COMPREHENSIVE METABOLIC PANEL
ALT: 18 U/L (ref 0–55)
AST: 16 U/L (ref 5–34)
Albumin: 3.7 g/dL (ref 3.5–5.0)
Alkaline Phosphatase: 107 U/L (ref 40–150)
Anion Gap: 7 mEq/L (ref 3–11)
BILIRUBIN TOTAL: 0.28 mg/dL (ref 0.20–1.20)
BUN: 19.8 mg/dL (ref 7.0–26.0)
CHLORIDE: 108 meq/L (ref 98–109)
CO2: 22 meq/L (ref 22–29)
CREATININE: 1.4 mg/dL — AB (ref 0.7–1.3)
Calcium: 9.3 mg/dL (ref 8.4–10.4)
EGFR: 61 mL/min/{1.73_m2} — AB (ref >90)
GLUCOSE: 154 mg/dL — AB (ref 70–140)
Potassium: 5.7 mEq/L — ABNORMAL HIGH (ref 3.5–5.1)
SODIUM: 137 meq/L (ref 136–145)
TOTAL PROTEIN: 6.9 g/dL (ref 6.4–8.3)

## 2016-09-04 LAB — CBC WITH DIFFERENTIAL (CANCER CENTER ONLY)
BASO#: 0 10*3/uL (ref 0.0–0.2)
BASO%: 0.2 % (ref 0.0–2.0)
EOS%: 4.2 % (ref 0.0–7.0)
Eosinophils Absolute: 0.2 10*3/uL (ref 0.0–0.5)
HCT: 37.8 % — ABNORMAL LOW (ref 38.7–49.9)
HGB: 12.9 g/dL — ABNORMAL LOW (ref 13.0–17.1)
LYMPH#: 1.4 10*3/uL (ref 0.9–3.3)
LYMPH%: 35.4 % (ref 14.0–48.0)
MCH: 30 pg (ref 28.0–33.4)
MCHC: 34.1 g/dL (ref 32.0–35.9)
MCV: 88 fL (ref 82–98)
MONO#: 0.6 10*3/uL (ref 0.1–0.9)
MONO%: 14.5 % — AB (ref 0.0–13.0)
NEUT#: 1.8 10*3/uL (ref 1.5–6.5)
NEUT%: 45.7 % (ref 40.0–80.0)
PLATELETS: 133 10*3/uL — AB (ref 145–400)
RBC: 4.3 10*6/uL (ref 4.20–5.70)
RDW: 14 % (ref 11.1–15.7)
WBC: 4 10*3/uL (ref 4.0–10.0)

## 2016-09-05 ENCOUNTER — Ambulatory Visit (HOSPITAL_BASED_OUTPATIENT_CLINIC_OR_DEPARTMENT_OTHER): Payer: Commercial Managed Care - HMO | Admitting: *Deleted

## 2016-09-05 DIAGNOSIS — D47Z9 Other specified neoplasms of uncertain behavior of lymphoid, hematopoietic and related tissue: Secondary | ICD-10-CM

## 2016-09-05 DIAGNOSIS — R0789 Other chest pain: Secondary | ICD-10-CM

## 2016-09-05 DIAGNOSIS — D469 Myelodysplastic syndrome, unspecified: Secondary | ICD-10-CM

## 2016-09-05 LAB — BASIC METABOLIC PANEL - CANCER CENTER ONLY
BUN, Bld: 20 mg/dL (ref 7–22)
CALCIUM: 9.3 mg/dL (ref 8.0–10.3)
CO2: 23 meq/L (ref 18–33)
Chloride: 107 mEq/L (ref 98–108)
Creat: 1.4 mg/dl — ABNORMAL HIGH (ref 0.6–1.2)
GLUCOSE: 240 mg/dL — AB (ref 73–118)
Potassium: 5.1 mEq/L — ABNORMAL HIGH (ref 3.3–4.7)
SODIUM: 140 meq/L (ref 128–145)

## 2016-09-11 ENCOUNTER — Other Ambulatory Visit (HOSPITAL_BASED_OUTPATIENT_CLINIC_OR_DEPARTMENT_OTHER): Payer: Commercial Managed Care - HMO

## 2016-09-11 DIAGNOSIS — R0789 Other chest pain: Secondary | ICD-10-CM

## 2016-09-11 DIAGNOSIS — D469 Myelodysplastic syndrome, unspecified: Secondary | ICD-10-CM | POA: Diagnosis not present

## 2016-09-11 DIAGNOSIS — D47Z9 Other specified neoplasms of uncertain behavior of lymphoid, hematopoietic and related tissue: Secondary | ICD-10-CM | POA: Diagnosis not present

## 2016-09-11 LAB — COMPREHENSIVE METABOLIC PANEL
ALBUMIN: 3.6 g/dL (ref 3.5–5.0)
ALK PHOS: 99 U/L (ref 40–150)
ALT: 21 U/L (ref 0–55)
AST: 22 U/L (ref 5–34)
Anion Gap: 7 mEq/L (ref 3–11)
BILIRUBIN TOTAL: 0.3 mg/dL (ref 0.20–1.20)
BUN: 22.6 mg/dL (ref 7.0–26.0)
CALCIUM: 9.4 mg/dL (ref 8.4–10.4)
CO2: 22 mEq/L (ref 22–29)
Chloride: 109 mEq/L (ref 98–109)
Creatinine: 1.5 mg/dL — ABNORMAL HIGH (ref 0.7–1.3)
EGFR: 55 mL/min/{1.73_m2} — ABNORMAL LOW (ref >90)
Glucose: 133 mg/dl (ref 70–140)
POTASSIUM: 5.3 meq/L — AB (ref 3.5–5.1)
Sodium: 138 mEq/L (ref 136–145)
Total Protein: 6.7 g/dL (ref 6.4–8.3)

## 2016-09-11 LAB — CBC WITH DIFFERENTIAL (CANCER CENTER ONLY)
BASO#: 0 10*3/uL (ref 0.0–0.2)
BASO%: 0 % (ref 0.0–2.0)
EOS ABS: 0.1 10*3/uL (ref 0.0–0.5)
EOS%: 3.7 % (ref 0.0–7.0)
HEMATOCRIT: 37.1 % — AB (ref 38.7–49.9)
HEMOGLOBIN: 12.8 g/dL — AB (ref 13.0–17.1)
LYMPH#: 1.1 10*3/uL (ref 0.9–3.3)
LYMPH%: 33.8 % (ref 14.0–48.0)
MCH: 30 pg (ref 28.0–33.4)
MCHC: 34.5 g/dL (ref 32.0–35.9)
MCV: 87 fL (ref 82–98)
MONO#: 0.5 10*3/uL (ref 0.1–0.9)
MONO%: 14.6 % — ABNORMAL HIGH (ref 0.0–13.0)
NEUT%: 47.9 % (ref 40.0–80.0)
NEUTROS ABS: 1.6 10*3/uL (ref 1.5–6.5)
Platelets: 131 10*3/uL — ABNORMAL LOW (ref 145–400)
RBC: 4.27 10*6/uL (ref 4.20–5.70)
RDW: 14.1 % (ref 11.1–15.7)
WBC: 3.3 10*3/uL — ABNORMAL LOW (ref 4.0–10.0)

## 2016-09-12 ENCOUNTER — Telehealth: Payer: Self-pay | Admitting: *Deleted

## 2016-09-12 LAB — TACROLIMUS LEVEL: TACROLIMUS LVL: 6.9 ng/mL (ref 2.0–20.0)

## 2016-09-12 NOTE — Telephone Encounter (Addendum)
Patient aware of results. Advised to avoid foods high in potassium.   ----- Message from [name], MD sent at 09/11/2016  5:52 PM EST ----- Call - the labs look ok!!  The K+ is 5.3.  pete

## 2016-09-17 ENCOUNTER — Other Ambulatory Visit: Payer: Self-pay | Admitting: *Deleted

## 2016-09-17 DIAGNOSIS — D469 Myelodysplastic syndrome, unspecified: Secondary | ICD-10-CM

## 2016-09-18 ENCOUNTER — Other Ambulatory Visit (HOSPITAL_BASED_OUTPATIENT_CLINIC_OR_DEPARTMENT_OTHER): Payer: Commercial Managed Care - HMO

## 2016-09-18 DIAGNOSIS — D469 Myelodysplastic syndrome, unspecified: Secondary | ICD-10-CM

## 2016-09-18 DIAGNOSIS — D47Z9 Other specified neoplasms of uncertain behavior of lymphoid, hematopoietic and related tissue: Secondary | ICD-10-CM

## 2016-09-18 LAB — COMPREHENSIVE METABOLIC PANEL
ALBUMIN: 3.8 g/dL (ref 3.5–5.0)
ALT: 18 U/L (ref 0–55)
AST: 18 U/L (ref 5–34)
Alkaline Phosphatase: 103 U/L (ref 40–150)
Anion Gap: 7 mEq/L (ref 3–11)
BUN: 21.9 mg/dL (ref 7.0–26.0)
CHLORIDE: 110 meq/L — AB (ref 98–109)
CO2: 23 mEq/L (ref 22–29)
Calcium: 9.3 mg/dL (ref 8.4–10.4)
Creatinine: 1.3 mg/dL (ref 0.7–1.3)
EGFR: 67 mL/min/{1.73_m2} — AB (ref >90)
GLUCOSE: 146 mg/dL — AB (ref 70–140)
POTASSIUM: 4.9 meq/L (ref 3.5–5.1)
SODIUM: 140 meq/L (ref 136–145)
Total Bilirubin: 0.37 mg/dL (ref 0.20–1.20)
Total Protein: 6.7 g/dL (ref 6.4–8.3)

## 2016-09-18 LAB — CBC WITH DIFFERENTIAL (CANCER CENTER ONLY)
BASO#: 0 10*3/uL (ref 0.0–0.2)
BASO%: 0.3 % (ref 0.0–2.0)
EOS%: 2.6 % (ref 0.0–7.0)
Eosinophils Absolute: 0.1 10*3/uL (ref 0.0–0.5)
HCT: 37.5 % — ABNORMAL LOW (ref 38.7–49.9)
HEMOGLOBIN: 12.8 g/dL — AB (ref 13.0–17.1)
LYMPH#: 1.3 10*3/uL (ref 0.9–3.3)
LYMPH%: 33.7 % (ref 14.0–48.0)
MCH: 29.6 pg (ref 28.0–33.4)
MCHC: 34.1 g/dL (ref 32.0–35.9)
MCV: 87 fL (ref 82–98)
MONO#: 0.6 10*3/uL (ref 0.1–0.9)
MONO%: 15.6 % — AB (ref 0.0–13.0)
NEUT%: 47.8 % (ref 40.0–80.0)
NEUTROS ABS: 1.9 10*3/uL (ref 1.5–6.5)
PLATELETS: 147 10*3/uL (ref 145–400)
RBC: 4.32 10*6/uL (ref 4.20–5.70)
RDW: 14.3 % (ref 11.1–15.7)
WBC: 3.9 10*3/uL — AB (ref 4.0–10.0)

## 2016-10-08 ENCOUNTER — Other Ambulatory Visit: Payer: Self-pay | Admitting: *Deleted

## 2016-10-08 DIAGNOSIS — D473 Essential (hemorrhagic) thrombocythemia: Secondary | ICD-10-CM

## 2016-10-08 DIAGNOSIS — D75839 Thrombocytosis, unspecified: Secondary | ICD-10-CM

## 2016-10-09 ENCOUNTER — Other Ambulatory Visit (HOSPITAL_BASED_OUTPATIENT_CLINIC_OR_DEPARTMENT_OTHER): Payer: Commercial Managed Care - HMO

## 2016-10-09 ENCOUNTER — Ambulatory Visit (HOSPITAL_BASED_OUTPATIENT_CLINIC_OR_DEPARTMENT_OTHER): Payer: Commercial Managed Care - HMO | Admitting: Hematology & Oncology

## 2016-10-09 VITALS — BP 135/76 | HR 79 | Temp 97.9°F | Resp 18

## 2016-10-09 DIAGNOSIS — Z9484 Stem cells transplant status: Secondary | ICD-10-CM | POA: Diagnosis not present

## 2016-10-09 DIAGNOSIS — D473 Essential (hemorrhagic) thrombocythemia: Secondary | ICD-10-CM

## 2016-10-09 DIAGNOSIS — D89813 Graft-versus-host disease, unspecified: Secondary | ICD-10-CM

## 2016-10-09 DIAGNOSIS — D89811 Chronic graft-versus-host disease: Secondary | ICD-10-CM | POA: Diagnosis not present

## 2016-10-09 DIAGNOSIS — D75839 Thrombocytosis, unspecified: Secondary | ICD-10-CM

## 2016-10-09 LAB — CBC WITH DIFFERENTIAL (CANCER CENTER ONLY)
BASO#: 0 10*3/uL (ref 0.0–0.2)
BASO%: 0.3 % (ref 0.0–2.0)
EOS ABS: 0.1 10*3/uL (ref 0.0–0.5)
EOS%: 2.4 % (ref 0.0–7.0)
HCT: 37.3 % — ABNORMAL LOW (ref 38.7–49.9)
HEMOGLOBIN: 12.5 g/dL — AB (ref 13.0–17.1)
LYMPH#: 1.1 10*3/uL (ref 0.9–3.3)
LYMPH%: 28.3 % (ref 14.0–48.0)
MCH: 29.8 pg (ref 28.0–33.4)
MCHC: 33.5 g/dL (ref 32.0–35.9)
MCV: 89 fL (ref 82–98)
MONO#: 0.6 10*3/uL (ref 0.1–0.9)
MONO%: 14.7 % — AB (ref 0.0–13.0)
NEUT#: 2.1 10*3/uL (ref 1.5–6.5)
NEUT%: 54.3 % (ref 40.0–80.0)
PLATELETS: 147 10*3/uL (ref 145–400)
RBC: 4.2 10*6/uL (ref 4.20–5.70)
RDW: 15.1 % (ref 11.1–15.7)
WBC: 3.8 10*3/uL — AB (ref 4.0–10.0)

## 2016-10-09 NOTE — Progress Notes (Signed)
Hematology and Oncology Follow Up Visit  DAWTON HAGGSTROM [id number] [birth year] 46 y.o. 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 Finer is back for a follow-up. He looks good. [place]. The [place] see were out in the [place]. They were in a luxury yert. They show me pictures. They really had a great time. I am glad that he was able to go out there and enjoy himself.   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 He's had no problem with his skin. He's had no problems with his teeth. He's had no problems with bowels or bladder.   His blood sugars have been on the higher side. He has a insulin pump.   He's had no fever. He's had no bleeding.   He says of the transplant doctors are trying to cut down on his tacrolimus.  He now is off Lovenox for the DVT. He is very happy about this.   As always, his wife is in an incredible source of strength. She has been with him every step of the way.  Currently, his performance status is ECOG 1.  Medications:  Current Outpatient Prescriptions:  .  acyclovir (ZOVIRAX) 400 MG tablet, Take 400 mg by mouth 2 (two) times daily., Disp: , Rfl:  .  amLODipine (NORVASC) 5 MG tablet, Take 5 mg by mouth daily., Disp: , Rfl:  .  cetirizine (ZYRTEC) 10 MG tablet, Take by mouth., Disp: , Rfl:  .  clobetasol cream (TEMOVATE) AB-123456789 %, Apply 1 application topically 2 (two) times daily as needed., Disp: , Rfl:  .  dexlansoprazole (DEXILANT) 60 MG capsule, Take by mouth daily. , Disp: , Rfl:  .  hydrocortisone cream 1 %, Apply 1 application topically 2 (two) times daily as needed for itching., Disp: , Rfl:  .  insulin aspart (NOVOLOG) 100 UNIT/ML injection, Inject into the skin. , Disp: ,  Rfl:  .  magnesium oxide (MAG-OX) 400 MG tablet, Take 800 mg by mouth 2 (two) times daily., Disp: , Rfl:  .  ondansetron (ZOFRAN) 8 MG tablet, Take 1 tablet (8 mg total) by mouth every 8 (eight) hours as needed for nausea or vomiting., Disp: 30 tablet, Rfl: 2 .  ONE TOUCH ULTRA TEST test strip, USE AS DIRECTED. TESTING FREQUENCY: 2-4 TIMES DAILY., Disp: , Rfl: 11 .  oxycodone (OXY-IR) 5 MG capsule, Take 5 mg by mouth every 3 (three) hours as needed., Disp: , Rfl:  .  posaconazole (NOXAFIL) 100 MG TBEC delayed-release tablet, Take 300 mg by mouth daily. , Disp: , Rfl:  .  sulfamethoxazole-trimethoprim (BACTRIM,SEPTRA) 400-80 MG tablet, Take 1 tablet by mouth daily., Disp: , Rfl:  .  tacrolimus (PROGRAF) 0.5 MG capsule, Take 1 mg by mouth 2 (two) times daily. Takes .5 mg in the am and 1 mg in the pm, Disp: , Rfl:  .  TOUJEO SOLOSTAR 300 UNIT/ML SOPN, TAKE 44 UNITS BEFORE BREAKFAST DAILY, Disp: , Rfl: 12 .  traMADol (ULTRAM) 50 MG tablet, Take 1 tablet (50 mg total) by mouth every 6 (six) hours as needed., Disp: 60 tablet, Rfl: 1 .  traZODone (DESYREL) 50 MG tablet, Take by mouth., Disp: , Rfl:   Allergies: No  Known Allergies  Past Medical History, Surgical history, Social history, and Family History were reviewed and updated.  Review of Systems:  As above  Physical Exam:  oral temperature is 97.9 F (36.6 C). His blood pressure is 135/76 and his pulse is 79. His respiration is 18 and oxygen saturation is 100%.   Wt Readings from Last 3 Encounters:  08/28/16 243 lb (110.2 kg)  08/12/16 250 lb (113.4 kg)  08/06/16 253 lb (114.8 kg)     Well-developed well-nourished white male in no obvious distress. Head exam shows total alopecia. He has no ocular or oral lesions. There is no scleral icterus. He has no mucositis. There is no adenopathy in the neck. Thyroid is non-palpable. Lungs are clear to percussion and ascultation bilaterally. Cardiac exam regular rate and rhythm with no murmurs, rubs  or bruits. Abdomen is soft. There are no abdominal masses. He has no guarding or rebound tenderness. Has no palpable liver or spleen tip. Back exam shows no tenderness over the spine, ribs or hips. Extremities shows no clubbing, cyanosis or edema. Neurological exam shows no focal neurological dose. He does have a limit of a tremor. Skin exam shows some ecchymoses on his abdomen from the Lovenox injections. 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  Lab Results  Component Value Date   WBC 3.8 (L) 10/09/2016   HGB 12.5 (L) 10/09/2016   HCT 37.3 (L) 10/09/2016   MCV 89 10/09/2016   PLT 147 10/09/2016     Chemistry      Component Value Date/Time   NA 140 09/18/2016 1004   K 4.9 09/18/2016 1004   CL 107 09/05/2016 1457   CO2 23 09/18/2016 1004   BUN 21.9 09/18/2016 1004   CREATININE 1.3 09/18/2016 1004      Component Value Date/Time   CALCIUM 9.3 09/18/2016 1004   ALKPHOS 103 09/18/2016 1004   AST 18 09/18/2016 1004   ALT 18 09/18/2016 1004   BILITOT 0.37 09/18/2016 1004         Impression and Plan: Mr. Stumpo is a 46 year old white male. 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  I do not see any issues with graft versus host with his skin. Ultimately, he will be able to keep coming down on his tacrolimus.  I think we can probably have him come back for labs in 3 weeks. I probably would see him back myself in 6 weeks.   I really have to believe that the allogeneic stem cell transplant has got him into remission and has cured him.   I think he goes back to [facility] in another 3 weeks.   [name], MD 1/11/20185:35 PM

## 2016-10-10 ENCOUNTER — Other Ambulatory Visit: Payer: Self-pay | Admitting: *Deleted

## 2016-10-10 DIAGNOSIS — D75839 Thrombocytosis, unspecified: Secondary | ICD-10-CM

## 2016-10-10 DIAGNOSIS — D473 Essential (hemorrhagic) thrombocythemia: Secondary | ICD-10-CM

## 2016-10-10 DIAGNOSIS — D469 Myelodysplastic syndrome, unspecified: Secondary | ICD-10-CM

## 2016-10-10 LAB — COMPREHENSIVE METABOLIC PANEL (CC13)
ALBUMIN: 3.9 g/dL (ref 3.5–5.5)
ALK PHOS: 98 IU/L (ref 39–117)
ALT: 15 IU/L (ref 0–44)
AST: 21 IU/L (ref 0–40)
Albumin/Globulin Ratio: 1.6 (ref 1.2–2.2)
BILIRUBIN TOTAL: 0.3 mg/dL (ref 0.0–1.2)
BUN / CREAT RATIO: 16 (ref 9–20)
BUN: 18 mg/dL (ref 6–24)
CHLORIDE: 105 mmol/L (ref 96–106)
Calcium, Ser: 9 mg/dL (ref 8.7–10.2)
Carbon Dioxide, Total: 22 mmol/L (ref 18–29)
Creatinine, Ser: 1.14 mg/dL (ref 0.76–1.27)
GFR calc Af Amer: 88 mL/min/{1.73_m2} (ref >59)
GFR calc non Af Amer: 76 mL/min/{1.73_m2} (ref >59)
GLUCOSE: 167 mg/dL — AB (ref 65–99)
Globulin, Total: 2.5 g/dL (ref 1.5–4.5)
Potassium, Ser: 5.4 mmol/L — ABNORMAL HIGH (ref 3.5–5.2)
Sodium: 139 mmol/L (ref 134–144)
Total Protein: 6.4 g/dL (ref 6.0–8.5)

## 2016-10-13 ENCOUNTER — Other Ambulatory Visit (HOSPITAL_BASED_OUTPATIENT_CLINIC_OR_DEPARTMENT_OTHER): Payer: Commercial Managed Care - HMO

## 2016-10-13 DIAGNOSIS — D469 Myelodysplastic syndrome, unspecified: Secondary | ICD-10-CM

## 2016-10-13 DIAGNOSIS — D75839 Thrombocytosis, unspecified: Secondary | ICD-10-CM

## 2016-10-13 DIAGNOSIS — D47Z9 Other specified neoplasms of uncertain behavior of lymphoid, hematopoietic and related tissue: Secondary | ICD-10-CM | POA: Diagnosis not present

## 2016-10-13 DIAGNOSIS — D473 Essential (hemorrhagic) thrombocythemia: Secondary | ICD-10-CM

## 2016-10-13 LAB — CMP (CANCER CENTER ONLY)
ALK PHOS: 101 U/L — AB (ref 26–84)
ALT: 23 U/L (ref 10–47)
AST: 24 U/L (ref 11–38)
Albumin: 3.9 g/dL (ref 3.3–5.5)
BUN: 22 mg/dL (ref 7–22)
CALCIUM: 9.3 mg/dL (ref 8.0–10.3)
CHLORIDE: 108 meq/L (ref 98–108)
CO2: 24 meq/L (ref 18–33)
Creat: 1.5 mg/dl — ABNORMAL HIGH (ref 0.6–1.2)
GLUCOSE: 131 mg/dL — AB (ref 73–118)
POTASSIUM: 4.7 meq/L (ref 3.3–4.7)
Sodium: 141 mEq/L (ref 128–145)
Total Bilirubin: 0.9 mg/dl (ref 0.20–1.60)
Total Protein: 7.1 g/dL (ref 6.4–8.1)

## 2016-10-16 ENCOUNTER — Encounter: Payer: Self-pay | Admitting: Hematology & Oncology

## 2016-10-22 ENCOUNTER — Other Ambulatory Visit: Payer: Self-pay | Admitting: *Deleted

## 2016-10-22 DIAGNOSIS — D469 Myelodysplastic syndrome, unspecified: Secondary | ICD-10-CM

## 2016-10-23 ENCOUNTER — Other Ambulatory Visit (HOSPITAL_BASED_OUTPATIENT_CLINIC_OR_DEPARTMENT_OTHER): Payer: Commercial Managed Care - HMO

## 2016-10-23 DIAGNOSIS — D47Z9 Other specified neoplasms of uncertain behavior of lymphoid, hematopoietic and related tissue: Secondary | ICD-10-CM | POA: Diagnosis not present

## 2016-10-23 DIAGNOSIS — D469 Myelodysplastic syndrome, unspecified: Secondary | ICD-10-CM | POA: Diagnosis not present

## 2016-10-23 LAB — CBC WITH DIFFERENTIAL (CANCER CENTER ONLY)
BASO#: 0 10*3/uL (ref 0.0–0.2)
BASO%: 0.2 % (ref 0.0–2.0)
EOS%: 2.6 % (ref 0.0–7.0)
Eosinophils Absolute: 0.1 10*3/uL (ref 0.0–0.5)
HEMATOCRIT: 39.8 % (ref 38.7–49.9)
HGB: 13.4 g/dL (ref 13.0–17.1)
LYMPH#: 1.4 10*3/uL (ref 0.9–3.3)
LYMPH%: 34 % (ref 14.0–48.0)
MCH: 29.7 pg (ref 28.0–33.4)
MCHC: 33.7 g/dL (ref 32.0–35.9)
MCV: 88 fL (ref 82–98)
MONO#: 0.5 10*3/uL (ref 0.1–0.9)
MONO%: 11.8 % (ref 0.0–13.0)
NEUT%: 51.4 % (ref 40.0–80.0)
NEUTROS ABS: 2.2 10*3/uL (ref 1.5–6.5)
PLATELETS: 163 10*3/uL (ref 145–400)
RBC: 4.51 10*6/uL (ref 4.20–5.70)
RDW: 14.9 % (ref 11.1–15.7)
WBC: 4.2 10*3/uL (ref 4.0–10.0)

## 2016-10-23 LAB — CMP (CANCER CENTER ONLY)
ALK PHOS: 104 U/L — AB (ref 26–84)
ALT: 26 U/L (ref 10–47)
AST: 24 U/L (ref 11–38)
Albumin: 3.8 g/dL (ref 3.3–5.5)
BILIRUBIN TOTAL: 0.9 mg/dL (ref 0.20–1.60)
BUN: 16 mg/dL (ref 7–22)
CALCIUM: 9.5 mg/dL (ref 8.0–10.3)
CO2: 25 mEq/L (ref 18–33)
Chloride: 106 mEq/L (ref 98–108)
Creat: 1.3 mg/dl — ABNORMAL HIGH (ref 0.6–1.2)
GLUCOSE: 130 mg/dL — AB (ref 73–118)
POTASSIUM: 4.7 meq/L (ref 3.3–4.7)
Sodium: 143 mEq/L (ref 128–145)
TOTAL PROTEIN: 6.9 g/dL (ref 6.4–8.1)

## 2016-10-23 LAB — MAGNESIUM: MAGNESIUM: 2 mg/dL (ref 1.5–2.5)

## 2016-10-25 LAB — TACROLIMUS LEVEL: Tacrolimus (FK506), Blood: 3.7 ng/mL (ref 2.0–20.0)

## 2016-10-30 DIAGNOSIS — E1065 Type 1 diabetes mellitus with hyperglycemia: Secondary | ICD-10-CM | POA: Insufficient documentation

## 2016-10-30 DIAGNOSIS — R5382 Chronic fatigue, unspecified: Secondary | ICD-10-CM | POA: Insufficient documentation

## 2016-10-30 DIAGNOSIS — E782 Mixed hyperlipidemia: Secondary | ICD-10-CM | POA: Insufficient documentation

## 2016-10-30 DIAGNOSIS — I10 Essential (primary) hypertension: Secondary | ICD-10-CM | POA: Insufficient documentation

## 2016-11-21 ENCOUNTER — Ambulatory Visit (HOSPITAL_BASED_OUTPATIENT_CLINIC_OR_DEPARTMENT_OTHER): Payer: Commercial Managed Care - HMO | Admitting: Hematology & Oncology

## 2016-11-21 ENCOUNTER — Other Ambulatory Visit (HOSPITAL_BASED_OUTPATIENT_CLINIC_OR_DEPARTMENT_OTHER): Payer: Commercial Managed Care - HMO

## 2016-11-21 VITALS — BP 153/95 | HR 91 | Temp 98.2°F | Resp 18 | Wt 242.2 lb

## 2016-11-21 DIAGNOSIS — L299 Pruritus, unspecified: Secondary | ICD-10-CM | POA: Diagnosis not present

## 2016-11-21 DIAGNOSIS — D47Z9 Other specified neoplasms of uncertain behavior of lymphoid, hematopoietic and related tissue: Secondary | ICD-10-CM | POA: Diagnosis not present

## 2016-11-21 DIAGNOSIS — E119 Type 2 diabetes mellitus without complications: Secondary | ICD-10-CM | POA: Diagnosis not present

## 2016-11-21 DIAGNOSIS — D473 Essential (hemorrhagic) thrombocythemia: Secondary | ICD-10-CM

## 2016-11-21 DIAGNOSIS — D469 Myelodysplastic syndrome, unspecified: Secondary | ICD-10-CM

## 2016-11-21 DIAGNOSIS — D89811 Chronic graft-versus-host disease: Secondary | ICD-10-CM

## 2016-11-21 DIAGNOSIS — D75839 Thrombocytosis, unspecified: Secondary | ICD-10-CM

## 2016-11-21 LAB — MAGNESIUM: MAGNESIUM: 2.3 mg/dL (ref 1.5–2.5)

## 2016-11-21 LAB — CBC WITH DIFFERENTIAL (CANCER CENTER ONLY)
BASO#: 0 10*3/uL (ref 0.0–0.2)
BASO%: 0.1 % (ref 0.0–2.0)
EOS%: 0.3 % (ref 0.0–7.0)
Eosinophils Absolute: 0 10*3/uL (ref 0.0–0.5)
HCT: 39.5 % (ref 38.7–49.9)
HEMOGLOBIN: 13.5 g/dL (ref 13.0–17.1)
LYMPH#: 1 10*3/uL (ref 0.9–3.3)
LYMPH%: 13 % — ABNORMAL LOW (ref 14.0–48.0)
MCH: 30.4 pg (ref 28.0–33.4)
MCHC: 34.2 g/dL (ref 32.0–35.9)
MCV: 89 fL (ref 82–98)
MONO#: 0.6 10*3/uL (ref 0.1–0.9)
MONO%: 7.5 % (ref 0.0–13.0)
NEUT%: 79.1 % (ref 40.0–80.0)
NEUTROS ABS: 6 10*3/uL (ref 1.5–6.5)
Platelets: 198 10*3/uL (ref 145–400)
RBC: 4.44 10*6/uL (ref 4.20–5.70)
RDW: 14.7 % (ref 11.1–15.7)
WBC: 7.6 10*3/uL (ref 4.0–10.0)

## 2016-11-21 LAB — CMP (CANCER CENTER ONLY)
ALK PHOS: 109 U/L — AB (ref 26–84)
ALT: 22 U/L (ref 10–47)
AST: 19 U/L (ref 11–38)
Albumin: 4 g/dL (ref 3.3–5.5)
BUN: 17 mg/dL (ref 7–22)
CHLORIDE: 104 meq/L (ref 98–108)
CO2: 26 mEq/L (ref 18–33)
CREATININE: 1 mg/dL (ref 0.6–1.2)
Calcium: 9.7 mg/dL (ref 8.0–10.3)
Glucose, Bld: 217 mg/dL — ABNORMAL HIGH (ref 73–118)
Potassium: 4.4 mEq/L (ref 3.3–4.7)
SODIUM: 141 meq/L (ref 128–145)
TOTAL PROTEIN: 7.1 g/dL (ref 6.4–8.1)
Total Bilirubin: 0.7 mg/dl (ref 0.20–1.60)

## 2016-11-21 LAB — CHCC SATELLITE - SMEAR

## 2016-11-21 NOTE — Progress Notes (Signed)
Hematology and Oncology Follow Up Visit  ABE REINE [id number] 10/10/1970 46 y.o. 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 Soos is back for a follow-up. He looks good. This is probably the best I have seen him look since the transplant.  He recently had some pruritus. 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 He really did not have much of a rash. They got him on a Medrol Dosepak. Of course, this really shot up his blood sugars. He is on insulin. He is very diligent in adjusting his insulin and try to manage his blood sugars.  He has had no fever. He has had no diarrhea. He's had no cough. He has had no nausea or vomiting.  He has not noted any swollen lymph nodes. He's had no dysphagia or odynophagia.  He's not noted any leg swelling.   He is trying to get a little bit more active. His wife is doing very good job trying to monitor his activities.   His appetite is doing quite well.   Currently, his performance status is ECOG 1.  Medications:  Current Outpatient Prescriptions:  .  traZODone (DESYREL) 50 MG tablet, Take by mouth., Disp: , Rfl:  .  acyclovir (ZOVIRAX) 400 MG tablet, Take 400 mg by mouth 2 (two) times daily., Disp: , Rfl:  .  amLODipine (NORVASC) 5 MG tablet, Take 5 mg by mouth daily., Disp: , Rfl:  .  cetirizine (ZYRTEC) 10 MG tablet, Take by mouth., Disp: , Rfl:  .  clobetasol cream (TEMOVATE) AB-123456789 %, Apply 1 application topically 2 (two) times daily as needed., Disp: , Rfl:  .  dexlansoprazole (DEXILANT) 60 MG capsule, Take by mouth daily. , Disp: , Rfl:  .  hydrocortisone cream 1 %, Apply 1 application topically 2 (two) times daily as needed for itching., Disp: , Rfl:  .  insulin aspart (NOVOLOG) 100 UNIT/ML injection, Inject into the skin.  , Disp: , Rfl:  .  magnesium oxide (MAG-OX) 400 MG tablet, Take 800 mg by mouth 2 (two) times daily., Disp: , Rfl:  .  ondansetron (ZOFRAN) 8 MG tablet, Take 1 tablet (8 mg total) by mouth every 8 (eight) hours as needed for nausea or vomiting. (Patient not taking: Reported on 11/21/2016), Disp: 30 tablet, Rfl: 2 .  ONE TOUCH ULTRA TEST test strip, USE AS DIRECTED. TESTING FREQUENCY: 2-4 TIMES DAILY., Disp: , Rfl: 11 .  oxycodone (OXY-IR) 5 MG capsule, Take 5 mg by mouth every 3 (three) hours as needed., Disp: , Rfl:  .  sulfamethoxazole-trimethoprim (BACTRIM,SEPTRA) 400-80 MG tablet, Take 1 tablet by mouth daily., Disp: , Rfl:  .  tacrolimus (PROGRAF) 0.5 MG capsule, Take 1 mg by mouth 2 (two) times daily. Takes .5 mg in the am and .5 mg in the pm, Disp: , Rfl:  .  TOUJEO SOLOSTAR 300 UNIT/ML SOPN, TAKE 44 UNITS BEFORE BREAKFAST DAILY, Disp: , Rfl: 12 .  traMADol (ULTRAM) 50 MG tablet, Take 1 tablet (50 mg total) by mouth every 6 (six) hours as needed. (Patient not taking: Reported on 11/21/2016), Disp: 60 tablet, Rfl: 1  Allergies: No Known Allergies  Past Medical History, Surgical history, Social history, and Family History were reviewed and updated.  Review of Systems:  As above  Physical Exam:  weight is 242 lb 4 oz (109.9 kg). His oral temperature is 98.2 F (36.8 C). His blood pressure is 153/95 (abnormal) and his pulse is 91. His respiration is 18 and oxygen saturation is 97%.   Wt Readings from Last 3 Encounters:  11/21/16 242 lb 4 oz (109.9 kg)  08/28/16 243 lb (110.2 kg)  08/12/16 250 lb (113.4 kg)     Well-developed well-nourished white male in no obvious distress. Head exam shows total alopecia. He has no ocular or oral lesions. There is no scleral icterus. He has no mucositis. There is no adenopathy in the neck. Thyroid is non-palpable. Lungs are clear to percussion and ascultation bilaterally. Cardiac exam regular rate and rhythm with no murmurs, rubs or bruits. Abdomen is  soft. There are no abdominal masses. He has no guarding or rebound tenderness. Has no palpable liver or spleen tip. Back exam shows no tenderness over the spine, ribs or hips. Extremities shows no clubbing, cyanosis or edema. Neurological exam shows no focal neurological dose. He does have a limit of a tremor. Skin exam shows some ecchymoses on his abdomen from the Lovenox injections. 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  Lab Results  Component Value Date   WBC 7.6 11/21/2016   HGB 13.5 11/21/2016   HCT 39.5 11/21/2016   MCV 89 11/21/2016   PLT 198 11/21/2016     Chemistry      Component Value Date/Time   NA 141 11/21/2016 1323   NA 140 09/18/2016 1004   K 4.4 11/21/2016 1323   K 4.9 09/18/2016 1004   CL 104 11/21/2016 1323   CO2 26 11/21/2016 1323   CO2 23 09/18/2016 1004   BUN 17 11/21/2016 1323   BUN 21.9 09/18/2016 1004   CREATININE 1.0 11/21/2016 1323   CREATININE 1.3 09/18/2016 1004      Component Value Date/Time   CALCIUM 9.7 11/21/2016 1323   CALCIUM 9.3 09/18/2016 1004   ALKPHOS 109 (H) 11/21/2016 1323   ALKPHOS 103 09/18/2016 1004   AST 19 11/21/2016 1323   AST 18 09/18/2016 1004   ALT 22 11/21/2016 1323   ALT 18 09/18/2016 1004   BILITOT 0.70 11/21/2016 1323   BILITOT 0.37 09/18/2016 1004         Impression and Plan: Patrick Blake is a 46 year old white male. He has a hybrid bone marrow disorder. He presented with incredible thrombocytosis. 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  This is probably the best that I see him look since his transplant. He really looks good. 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 He is on a Medrol dosepak.  His blood counts look great. I told him that I thought his white cell count was up a little bit because of the Medrol dose pack.  His potassium is 4.4. His creatinine is 1.0. His liver function tests are all normal.  The alkaline phosphatase is up a little bit but not too worried about that.  I really have to believe that the allogeneic stem cell transplant has gotten him into remission and has cured him.   I will like to see him back in 6 weeks. I think he goes back to see the transplant doctors at [facility] in late April.  [name], MD 2/23/20184:06 PM

## 2016-11-23 LAB — TACROLIMUS LEVEL: Tacrolimus (FK506), Blood: 1.5 ng/mL — ABNORMAL LOW (ref 2.0–20.0)

## 2016-11-24 ENCOUNTER — Telehealth: Payer: Self-pay | Admitting: *Deleted

## 2016-11-24 NOTE — Telephone Encounter (Addendum)
Patient aware of results. Results sent to [facility]  ----- Message from [name], MD sent at 11/21/2016  4:30 PM EST ----- Call - the K+, Mg2+ and kidney function are all looking good!!!  Dierdre Searles, MD  P Onc Nurse Hp        Send this result to [facility] transplant team!! Thanks!! pete

## 2016-12-12 ENCOUNTER — Other Ambulatory Visit: Payer: Self-pay | Admitting: *Deleted

## 2016-12-12 ENCOUNTER — Encounter: Payer: Self-pay | Admitting: *Deleted

## 2016-12-12 ENCOUNTER — Ambulatory Visit (HOSPITAL_BASED_OUTPATIENT_CLINIC_OR_DEPARTMENT_OTHER): Payer: Commercial Managed Care - HMO

## 2016-12-12 DIAGNOSIS — D47Z9 Other specified neoplasms of uncertain behavior of lymphoid, hematopoietic and related tissue: Secondary | ICD-10-CM | POA: Diagnosis not present

## 2016-12-12 DIAGNOSIS — D469 Myelodysplastic syndrome, unspecified: Secondary | ICD-10-CM

## 2016-12-12 LAB — CBC WITH DIFFERENTIAL (CANCER CENTER ONLY)
BASO#: 0 10*3/uL (ref 0.0–0.2)
BASO%: 0.5 % (ref 0.0–2.0)
EOS%: 3.7 % (ref 0.0–7.0)
Eosinophils Absolute: 0.2 10*3/uL (ref 0.0–0.5)
HCT: 39.6 % (ref 38.7–49.9)
HGB: 13.1 g/dL (ref 13.0–17.1)
LYMPH#: 1.2 10*3/uL (ref 0.9–3.3)
LYMPH%: 26.8 % (ref 14.0–48.0)
MCH: 29.6 pg (ref 28.0–33.4)
MCHC: 33.1 g/dL (ref 32.0–35.9)
MCV: 90 fL (ref 82–98)
MONO#: 0.6 10*3/uL (ref 0.1–0.9)
MONO%: 13.8 % — AB (ref 0.0–13.0)
NEUT#: 2.4 10*3/uL (ref 1.5–6.5)
NEUT%: 55.2 % (ref 40.0–80.0)
PLATELETS: 160 10*3/uL (ref 145–400)
RBC: 4.42 10*6/uL (ref 4.20–5.70)
RDW: 14.6 % (ref 11.1–15.7)
WBC: 4.3 10*3/uL (ref 4.0–10.0)

## 2016-12-12 LAB — CMP (CANCER CENTER ONLY)
ALT(SGPT): 22 U/L (ref 10–47)
AST: 22 U/L (ref 11–38)
Albumin: 3.5 g/dL (ref 3.3–5.5)
Alkaline Phosphatase: 104 U/L — ABNORMAL HIGH (ref 26–84)
BUN: 14 mg/dL (ref 7–22)
CALCIUM: 9.5 mg/dL (ref 8.0–10.3)
CO2: 27 mEq/L (ref 18–33)
Chloride: 102 mEq/L (ref 98–108)
Creat: 1 mg/dl (ref 0.6–1.2)
GLUCOSE: 152 mg/dL — AB (ref 73–118)
POTASSIUM: 4.4 meq/L (ref 3.3–4.7)
Sodium: 144 mEq/L (ref 128–145)
Total Bilirubin: 0.7 mg/dl (ref 0.20–1.60)
Total Protein: 6.5 g/dL (ref 6.4–8.1)

## 2016-12-12 LAB — MAGNESIUM: Magnesium: 2.2 mg/dl (ref 1.5–2.5)

## 2016-12-14 LAB — TACROLIMUS LEVEL: Tacrolimus (FK506), Blood: 1.5 ng/mL — ABNORMAL LOW (ref 2.0–20.0)

## 2017-01-02 ENCOUNTER — Other Ambulatory Visit: Payer: Commercial Managed Care - HMO

## 2017-01-08 ENCOUNTER — Other Ambulatory Visit: Payer: Commercial Managed Care - HMO

## 2017-01-08 ENCOUNTER — Ambulatory Visit: Payer: Commercial Managed Care - HMO | Admitting: Hematology & Oncology

## 2017-02-18 ENCOUNTER — Other Ambulatory Visit: Payer: Self-pay | Admitting: *Deleted

## 2017-02-18 DIAGNOSIS — D469 Myelodysplastic syndrome, unspecified: Secondary | ICD-10-CM

## 2017-02-20 ENCOUNTER — Other Ambulatory Visit (HOSPITAL_BASED_OUTPATIENT_CLINIC_OR_DEPARTMENT_OTHER): Payer: Commercial Managed Care - HMO

## 2017-02-20 DIAGNOSIS — D47Z9 Other specified neoplasms of uncertain behavior of lymphoid, hematopoietic and related tissue: Secondary | ICD-10-CM | POA: Diagnosis not present

## 2017-02-20 DIAGNOSIS — D469 Myelodysplastic syndrome, unspecified: Secondary | ICD-10-CM

## 2017-02-20 LAB — CBC WITH DIFFERENTIAL (CANCER CENTER ONLY)
BASO#: 0 10*3/uL (ref 0.0–0.2)
BASO%: 0.2 % (ref 0.0–2.0)
EOS ABS: 0.2 10*3/uL (ref 0.0–0.5)
EOS%: 4.9 % (ref 0.0–7.0)
HEMATOCRIT: 42.2 % (ref 38.7–49.9)
HEMOGLOBIN: 14 g/dL (ref 13.0–17.1)
LYMPH#: 1.9 10*3/uL (ref 0.9–3.3)
LYMPH%: 45.8 % (ref 14.0–48.0)
MCH: 28.9 pg (ref 28.0–33.4)
MCHC: 33.2 g/dL (ref 32.0–35.9)
MCV: 87 fL (ref 82–98)
MONO#: 0.5 10*3/uL (ref 0.1–0.9)
MONO%: 11.6 % (ref 0.0–13.0)
NEUT%: 37.5 % — ABNORMAL LOW (ref 40.0–80.0)
NEUTROS ABS: 1.5 10*3/uL (ref 1.5–6.5)
Platelets: 161 10*3/uL (ref 145–400)
RBC: 4.85 10*6/uL (ref 4.20–5.70)
RDW: 15.5 % (ref 11.1–15.7)
WBC: 4.1 10*3/uL (ref 4.0–10.0)

## 2017-02-20 LAB — COMPREHENSIVE METABOLIC PANEL
ALBUMIN: 4 g/dL (ref 3.5–5.0)
ALK PHOS: 109 U/L (ref 40–150)
ALT: 11 U/L (ref 0–55)
AST: 19 U/L (ref 5–34)
Anion Gap: 8 mEq/L (ref 3–11)
BILIRUBIN TOTAL: 0.52 mg/dL (ref 0.20–1.20)
BUN: 14.9 mg/dL (ref 7.0–26.0)
CALCIUM: 9.6 mg/dL (ref 8.4–10.4)
CO2: 24 mEq/L (ref 22–29)
Chloride: 107 mEq/L (ref 98–109)
Creatinine: 1.1 mg/dL (ref 0.7–1.3)
EGFR: 83 mL/min/{1.73_m2} — ABNORMAL LOW (ref >90)
GLUCOSE: 191 mg/dL — AB (ref 70–140)
POTASSIUM: 4.8 meq/L (ref 3.5–5.1)
Sodium: 139 mEq/L (ref 136–145)
TOTAL PROTEIN: 6.9 g/dL (ref 6.4–8.3)

## 2017-02-20 LAB — LACTATE DEHYDROGENASE: LDH: 160 U/L (ref 125–245)

## 2017-02-20 LAB — MAGNESIUM: Magnesium: 2.3 mg/dl (ref 1.5–2.5)

## 2017-02-21 LAB — VITAMIN D 25 HYDROXY (VIT D DEFICIENCY, FRACTURES): VIT D 25 HYDROXY: 19.8 ng/mL — AB (ref 30.0–100.0)

## 2017-02-22 LAB — TACROLIMUS LEVEL: Tacrolimus (FK506), Blood: NOT DETECTED ng/mL (ref 2.0–20.0)

## 2017-02-24 ENCOUNTER — Encounter: Payer: Self-pay | Admitting: *Deleted

## 2017-02-24 NOTE — Progress Notes (Signed)
Labwork from 02/20/17 routed to Dr. Annia Belt

## 2017-03-30 ENCOUNTER — Other Ambulatory Visit: Payer: Self-pay | Admitting: *Deleted

## 2017-03-30 DIAGNOSIS — D473 Essential (hemorrhagic) thrombocythemia: Secondary | ICD-10-CM

## 2017-03-30 DIAGNOSIS — D75839 Thrombocytosis, unspecified: Secondary | ICD-10-CM

## 2017-03-30 DIAGNOSIS — D469 Myelodysplastic syndrome, unspecified: Secondary | ICD-10-CM

## 2017-03-31 ENCOUNTER — Other Ambulatory Visit (HOSPITAL_BASED_OUTPATIENT_CLINIC_OR_DEPARTMENT_OTHER): Payer: Commercial Managed Care - HMO

## 2017-03-31 DIAGNOSIS — D473 Essential (hemorrhagic) thrombocythemia: Secondary | ICD-10-CM | POA: Diagnosis not present

## 2017-03-31 DIAGNOSIS — D75839 Thrombocytosis, unspecified: Secondary | ICD-10-CM

## 2017-03-31 DIAGNOSIS — D469 Myelodysplastic syndrome, unspecified: Secondary | ICD-10-CM

## 2017-03-31 LAB — CBC WITH DIFFERENTIAL (CANCER CENTER ONLY)
BASO#: 0 10*3/uL (ref 0.0–0.2)
BASO%: 0.2 % (ref 0.0–2.0)
EOS%: 2.7 % (ref 0.0–7.0)
Eosinophils Absolute: 0.1 10*3/uL (ref 0.0–0.5)
HEMATOCRIT: 42.3 % (ref 38.7–49.9)
HEMOGLOBIN: 14.2 g/dL (ref 13.0–17.1)
LYMPH#: 1.7 10*3/uL (ref 0.9–3.3)
LYMPH%: 42 % (ref 14.0–48.0)
MCH: 29.8 pg (ref 28.0–33.4)
MCHC: 33.6 g/dL (ref 32.0–35.9)
MCV: 89 fL (ref 82–98)
MONO#: 0.4 10*3/uL (ref 0.1–0.9)
MONO%: 9.7 % (ref 0.0–13.0)
NEUT#: 1.9 10*3/uL (ref 1.5–6.5)
NEUT%: 45.4 % (ref 40.0–80.0)
Platelets: 155 10*3/uL (ref 145–400)
RBC: 4.77 10*6/uL (ref 4.20–5.70)
RDW: 16.5 % — ABNORMAL HIGH (ref 11.1–15.7)
WBC: 4.1 10*3/uL (ref 4.0–10.0)

## 2017-03-31 LAB — COMPREHENSIVE METABOLIC PANEL
ANION GAP: 7 meq/L (ref 3–11)
AST: 17 U/L (ref 5–34)
Albumin: 3.9 g/dL (ref 3.5–5.0)
Alkaline Phosphatase: 100 U/L (ref 40–150)
BILIRUBIN TOTAL: 0.54 mg/dL (ref 0.20–1.20)
BUN: 17.4 mg/dL (ref 7.0–26.0)
CHLORIDE: 109 meq/L (ref 98–109)
CO2: 25 meq/L (ref 22–29)
Calcium: 9.5 mg/dL (ref 8.4–10.4)
Creatinine: 1.1 mg/dL (ref 0.7–1.3)
EGFR: 82 mL/min/{1.73_m2} — AB (ref >90)
GLUCOSE: 161 mg/dL — AB (ref 70–140)
Potassium: 4.3 mEq/L (ref 3.5–5.1)
SODIUM: 140 meq/L (ref 136–145)
TOTAL PROTEIN: 6.9 g/dL (ref 6.4–8.3)

## 2017-03-31 LAB — MAGNESIUM: Magnesium: 2.2 mg/dl (ref 1.5–2.5)

## 2017-03-31 LAB — LACTATE DEHYDROGENASE: LDH: 164 U/L (ref 125–245)

## 2017-04-01 LAB — VITAMIN D 25 HYDROXY (VIT D DEFICIENCY, FRACTURES): VIT D 25 HYDROXY: 21.1 ng/mL — AB (ref 30.0–100.0)

## 2017-04-02 ENCOUNTER — Telehealth: Payer: Self-pay | Admitting: *Deleted

## 2017-04-02 LAB — TACROLIMUS LEVEL: Tacrolimus (FK506), Blood: NOT DETECTED ng/mL (ref 2.0–20.0)

## 2017-04-02 NOTE — Telephone Encounter (Addendum)
Patient's vitamin D is managed by another physician. Patient is already on 2000 units daily. Patient will follow up with other physician about dose adjustment.   ----- Message from [name], MD sent at 04/01/2017  7:27 AM EDT ----- Call - vit D is way too low!!!  He needs to be taking 2000 units a day!!!  pete

## 2017-05-22 ENCOUNTER — Other Ambulatory Visit: Payer: Self-pay | Admitting: *Deleted

## 2017-05-22 ENCOUNTER — Other Ambulatory Visit: Payer: Self-pay | Admitting: Pharmacist

## 2017-05-22 DIAGNOSIS — D469 Myelodysplastic syndrome, unspecified: Secondary | ICD-10-CM

## 2017-07-08 ENCOUNTER — Ambulatory Visit (HOSPITAL_BASED_OUTPATIENT_CLINIC_OR_DEPARTMENT_OTHER): Payer: Commercial Managed Care - HMO

## 2017-07-08 DIAGNOSIS — D469 Myelodysplastic syndrome, unspecified: Secondary | ICD-10-CM

## 2017-07-08 DIAGNOSIS — Z23 Encounter for immunization: Secondary | ICD-10-CM | POA: Diagnosis not present

## 2017-07-08 MED ORDER — DTAP-HEPATITIS B RECOMB-IPV IM SUSP
0.5000 mL | Freq: Once | INTRAMUSCULAR | Status: AC
  Administered 2017-07-08: 0.5 mL via INTRAMUSCULAR
  Filled 2017-07-08: qty 0.5

## 2017-07-08 MED ORDER — HAEMOPHILUS B POLYSAC CONJ VAC IM SOLR
0.5000 mL | Freq: Once | INTRAMUSCULAR | Status: AC
  Administered 2017-07-08: 0.5 mL via INTRAMUSCULAR
  Filled 2017-07-08: qty 0.5

## 2017-07-08 MED ORDER — PNEUMOCOCCAL 13-VAL CONJ VACC IM SUSP
0.5000 mL | Freq: Once | INTRAMUSCULAR | Status: AC
  Administered 2017-07-08: 0.5 mL via INTRAMUSCULAR
  Filled 2017-07-08: qty 0.5

## 2017-07-08 NOTE — Patient Instructions (Signed)
Your Child's First Vaccines: What You Need to Know The vaccines covered on this statement are those most likely to be given during the same visits during infancy and early childhood. Other vaccines (including measles, mumps, and rubella; varicella; rotavirus; influenza; and hepatitis A) are also routinely recommended during the first five years of life. Your child will get these vaccines today: _____DTaP _____Hib _____Hepatitis B _____Polio _____PCV13 (Provider: Check appropriate blanks.) 1. Why get vaccinated? Vaccine-preventable diseases are much less common than they used to be, thanks to vaccination. But they have not gone away. Outbreaks of some of these diseases still occur across the [place]. When fewer babies get vaccinated, more babies get sick. Seven childhood diseases that can be prevented by vaccines: 1. Diphtheria (the 'D' in DTaP vaccine)  Signs and symptoms include a thick coating in the back of the throat that can make it hard to breathe.  Diphtheria can lead to breathing problems, paralysis and heart failure. ? About 15,000 people died each year in the [place] from diphtheria before there was a vaccine. 2. Tetanus (the 'T' in DTaP vaccine, also known as Lockjaw)  Signs and symptoms include painful tightening of the muscles, usually all over the body.  Tetanus can lead to stiffness of the jaw that can make it difficult to open the mouth or swallow. ? Tetanus kills about 1 person out of every 10 who get it. 3. Pertussis (the 'P' in DTaP vaccine, also known as Whooping Cough)  Signs and symptoms include violent coughing spells that can make it hard for a baby to eat, drink, or breathe. These spells can last for several weeks.  Pertussis can lead to pneumonia, seizures, brain damage, or death. Pertussis can be very dangerous in infants. ? Most pertussis deaths are in babies younger than 55 months of age. 4. Hib ( Haemophilus influenzae type b)  Signs and symptoms can  include fever, headache, stiff neck, cough, and shortness of breath. There might not be any signs or symptoms in mild cases.  Hib can lead to meningitis (infection of the brain and spinal cord coverings); pneumonia; infections of the ears, sinuses, blood, joints, bones, and covering of the heart; brain damage; severe swelling of the throat, making it hard to breathe; and deafness. ? Children younger than 85 years of age are at greatest risk for Hib disease. 5. Hepatitis B  Signs and symptoms include tiredness, diarrhea and vomiting, jaundice (yellow skin or eyes), and pain in muscles, joints and stomach. But usually there are no signs or symptoms at all.  Hepatitis B can lead to liver damage, and liver cancer. Some people develop chronic (long term) hepatitis B infection. These people might not look or feel sick, but they can infect others. ? Hepatitis B can cause liver damage and cancer in 1 child out of 4 who are chronically infected. 6. Polio  Signs and symptoms can include flu-like illness, or there may be no signs or symptoms at all.  Polio can lead to permanent paralysis (can't move an arm or leg, or sometimes can't breathe) and death. ? In the 1950s, polio paralyzed more than 15,000 people every year in the [place] 7. Pneumococcal disease  Signs and symptoms include fever, chills, cough, and chest pain. In infants, symptoms can also include meningitis, seizures, and sometimes rash.  Pneumococcal disease can lead to meningitis (infection of the brain and spinal cord coverings); infections of the ears, sinuses and blood; pneumonia; deafness; and brain damage. ? About 1 out of  15 children who get pneumococcal meningitis will die from the infection. Children usually catch these diseases from other children or adults, who might not even know they are infected. A mother infected with hepatitis B can infect her baby at birth. Tetanus enters the body through a cut or wound; it is not spread from  person to person. Vaccines that protect your baby from these seven diseases:  Vaccine: DTaP (Diphtheria, Tetanus, Pertussis) ? Number of doses: 5 ? Recommended ages: 2 months, 4 months, 6 months, 15-18 months, 4-6 years ? Other information: Some children get a vaccine called DT (Diphtheria &amp; tetanus) instead of DTaP.  Vaccine: Hepatitis B ? Number of doses: 3 ? Recommended ages: Birth, 1-2 months, 6-18 months  Vaccine: Polio ? Number of doses: 4 ? Recommended ages: 2 months, 4 months, 6-18 months, 4-6 years ? Other information: An additional dose of polio vaccine may be recommended for travel to certain countries.  Vaccine: Hib (Haemophilus influenzae type b) ? Number of doses: 3 or 4 ? Recommended ages: 2 months, 4 months, (6 months), 12-15 months ? Other information: There are several Hib vaccines. With one of them the 35-month dose is not needed.  Vaccine: Pneumococcal (PCV13) ? Number of doses: 4 ? Recommended ages: 2 months, 4 months, 6 months, 12-15 months ? Other information: Older children with certain health conditions also need this vaccine. Your healthcare provider might offer some of these vaccines as combination vaccines-several vaccines given in the same shot. Combination vaccines are as safe and effective as the individual vaccines, and can mean fewer shots for your baby. 2. Some children should not get certain vaccines Most children can safely get all of these vaccines. But there are some exceptions:  A child who has a mild cold or other illness on the day vaccinations are scheduled may be vaccinated. A child who is moderately or severely ill on the day of vaccinations might be asked to come back for them at a later date.  Any child who had a life-threatening allergic reaction after getting a vaccine should not get another dose of that vaccine. Tell the person giving the vaccines if your child has ever had a severe reaction after any vaccination.  A child who has  a severe (life-threatening) allergy to a substance should not get a vaccine that contains that substance. Tell the person giving your child the vaccines if your child has any severe allergies that you are aware of.  Talk to your doctor before your child gets:  DTaP vaccine, if your child ever had any of these reactions after a previous dose of DTaP: ? A brain or nervous system disease within 7 days, ? Non-stop crying for 3 hours or more, ? A seizure or collapse, ? A fever of over 105F.  PCV13 vaccine, if your child ever had a severe reaction after a dose of DTaP (or other vaccine containing diphtheria toxoid), or after a dose of PCV7, an earlier pneumococcal vaccine. 3. What are the risks of a vaccine reaction? With any medicine, including vaccines, there is a chance of side effects. These are usually mild and go away on their own. Most vaccine reactions are not serious: tenderness, redness, or swelling where the shot was given; or a mild fever. These happen soon after the shot is given and go away within a day or two. They happen with up to about half of vaccinations, depending on the vaccine. Serious reactions are also possible but are rare. Polio, Hepatitis B  and Hib vaccines have been associated only with mild reactions. DTaP and pneumococcal vaccines have also been associated with other problems:  DTaP vaccine ? Mild problems: Fussiness (up to 1 child in 3); tiredness or loss of appetite (up to 1 child in 10); vomiting (up to 1 child in 50); swelling of the entire arm or leg for 1-7 days (up to 1 child in 30)-usually after the 4th or 5th dose. ? Moderate problems: Seizure (1 child in 14,000); non-stop crying for 3 hours or longer (up to 1 child in 1,000); fever over 105F (1 child in 16,000). ? Serious problems: Long term seizures, coma, lowered consciousness, and permanent brain damage have been reported following DTaP vaccination. These reports are extremely rare.  Pneumococcal  vaccine ? Mild problems: Drowsiness or temporary loss of appetite (about 1 child in 2 or 3); fussiness (about 8 children in 10). ? Moderate problems: Fever over 102.28F (about 1 child in 20).  After any vaccine: Any medication can cause a severe allergic reaction. Such reactions from a vaccine are very rare, estimated at about 1 in a million doses, and would happen within a few minutes to a few hours after the vaccination. As with any medicine, there is a very remote chance of a vaccine causing a serious injury or death. The safety of vaccines is always being monitored. For more information, visit: http://www.aguilar.org/ 4. What if there is a serious reaction? What should I look for? Look for anything that concerns you, such as signs of a severe allergic reaction, very high fever, or unusual behavior. Signs of a severe allergic reaction can include hives, swelling of the face and throat, and difficulty breathing. In infants, signs of an allergic reaction might also include fever, sleepiness, and disinterest in eating. In older children signs might include a fast heartbeat, dizziness, and weakness. These would usually start a few minutes to a few hours after the vaccination. What should I do?  If you think it is a severe allergic reaction or other emergency that can't wait, call 9-1-1 or get the person to the nearest hospital. Otherwise, call your doctor.  Afterward, the reaction should be reported to the Vaccine Adverse Event Reporting System (VAERS). Your doctor should file this report, or you can do it yourself through the VAERS web site at www.vaers.SamedayNews.es, or by calling (762)683-4425.  VAERS does not give medical advice. 5. The National Vaccine Injury Compensation Program The National Vaccine Injury Compensation Program (VICP) is a federal program that was created to compensate people who may have been injured by certain vaccines. Persons who believe they may have been injured by a  vaccine can learn about the program and about filing a claim by calling (519)741-2496 or visiting the [place] website at GoldCloset.com.ee. There is a time limit to file a claim for compensation. 6. How can I learn more?  Ask your healthcare provider. He or she can give you the vaccine package insert or suggest other sources of information.  Call your local or state health department.  Contact the Centers for Disease Control and Prevention (CDC): ? Call 9401840735 (1-800-CDC-INFO) ? Visit CDC's website at http://hunter.com/ or InternetEnthusiasts.hu Vaccine Information Statement, Multi Pediatric Vaccines (08/03/2014) This information is not intended to replace advice given to you by your health care provider. Make sure you discuss any questions you have with your health care provider. Document Released: 03/03/2008 Document Revised: 07/30/2016 Document Reviewed: 07/30/2016 Elsevier Interactive Patient Education  2017 Else[employer]. Pneumococcal Conjugate Vaccine suspension for injection  What is this medicine? PNEUMOCOCCAL VACCINE (NEU mo KOK al vak SEEN) is a vaccine used to prevent pneumococcus bacterial infections. These bacteria can cause serious infections like pneumonia, meningitis, and blood infections. This vaccine will lower your chance of getting pneumonia. If you do get pneumonia, it can make your symptoms milder and your illness shorter. This vaccine will not treat an infection and will not cause infection. This vaccine is recommended for infants and young children, adults with certain medical conditions, and adults 30 years or older. This medicine may be used for other purposes; ask your health care provider or pharmacist if you have questions. COMMON BRAND NAME(S): Prevnar, Prevnar 13 What should I tell my health care provider before I take this medicine? They need to know if you have any of these conditions: -bleeding problems -fever -immune system problems -an  unusual or allergic reaction to pneumococcal vaccine, diphtheria toxoid, other vaccines, latex, other medicines, foods, dyes, or preservatives -pregnant or trying to get pregnant -breast-feeding How should I use this medicine? This vaccine is for injection into a muscle. It is given by a health care professional. A copy of Vaccine Information Statements will be given before each vaccination. Read this sheet carefully each time. The sheet may change frequently. Talk to your pediatrician regarding the use of this medicine in children. While this drug may be prescribed for children as young as 78 weeks old for selected conditions, precautions do apply. Overdosage: If you think you have taken too much of this medicine contact a poison control center or emergency room at once. NOTE: This medicine is only for you. Do not share this medicine with others. What if I miss a dose? It is important not to miss your dose. Call your doctor or health care professional if you are unable to keep an appointment. What may interact with this medicine? -medicines for cancer chemotherapy -medicines that suppress your immune function -steroid medicines like prednisone or cortisone This list may not describe all possible interactions. Give your health care provider a list of all the medicines, herbs, non-prescription drugs, or dietary supplements you use. Also tell them if you smoke, drink alcohol, or use illegal drugs. Some items may interact with your medicine. What should I watch for while using this medicine? Mild fever and pain should go away in 3 days or less. Report any unusual symptoms to your doctor or health care professional. What side effects may I notice from receiving this medicine? Side effects that you should report to your doctor or health care professional as soon as possible: -allergic reactions like skin rash, itching or hives, swelling of the face, lips, or tongue -breathing  problems -confused -fast or irregular heartbeat -fever over 102 degrees F -seizures -unusual bleeding or bruising -unusual muscle weakness Side effects that usually do not require medical attention (report to your doctor or health care professional if they continue or are bothersome): -aches and pains -diarrhea -fever of 102 degrees F or less -headache -irritable -loss of appetite -pain, tender at site where injected -trouble sleeping This list may not describe all possible side effects. Call your doctor for medical advice about side effects. You may report side effects to FDA at 1-800-FDA-1088. Where should I keep my medicine? This does not apply. This vaccine is given in a clinic, pharmacy, doctor's office, or other health care setting and will not be stored at home. NOTE: This sheet is a summary. It may not cover all possible information. If you have questions about  this medicine, talk to your doctor, pharmacist, or health care provider.  2018 Elsevier/Gold Standard (2014-06-22 10:27:27) Haemophilus influenzae type b Conjugate Vaccine injection What is this medicine? HAEMOPHILUS INFLUENZAE TYPE B CONJUGATE VACCINE (hem OFF fil [place] in floo En zuh type B KAN ji get VAK seen) is used to prevent infections of a Haemophilus bacteria. This medicine may be used for other purposes; ask your health care provider or pharmacist if you have questions. COMMON BRAND NAME(S): ActHIB, Hiberix, HibTITER, PedvaxHIB What should I tell my health care provider before I take this medicine? They need to know if you have any of these conditions: -bleeding disorder -Guillain-Barre syndrome -immune system problems -infection with fever -low levels of platelets in the blood -take medicines that treat or prevent blood clots -an unusual or allergic reaction to vaccines, other medicines, foods, dyes, or preservatives -pregnant or trying to get pregnant -breast-feeding How should I use this medicine? This  vaccine is for injection into a muscle. It is given by a health care professional. A copy of Vaccine Information Statements will be given before each vaccination. Read this sheet carefully each time. The sheet may change frequently. Talk to your pediatrician regarding the use of this medicine in children. While this drug may be prescribed for children as young as 79 months old for selected conditions, precautions do apply. Overdosage: If you think you have taken too much of this medicine contact a poison control center or emergency room at once. NOTE: This medicine is only for you. Do not share this medicine with others. What if I miss a dose? Keep appointments for follow-up (booster) doses as directed. It is important not to miss your dose. Call your doctor or health care professional if you are unable to keep an appointment. What may interact with this medicine? -adalimumab -anakinra -infliximab -medicines that suppress your immune system -medicines that treat or prevent blood clots like warfarin, enoxaparin, and dalteparin -medicines to treat cancer This list may not describe all possible interactions. Give your health care provider a list of all the medicines, herbs, non-prescription drugs, or dietary supplements you use. Also tell them if you smoke, drink alcohol, or use illegal drugs. Some items may interact with your medicine. What should I watch for while using this medicine? Visit your doctor for regular check-ups as directed. This vaccine, like all vaccines, may not fully protect everyone. What side effects may I notice from receiving this medicine? Side effects that you should report to your doctor or health care professional as soon as possible: -allergic reactions like skin rash, itching or hives, swelling of the face, lips, or tongue -breathing problems -extreme changes in behavior -fever over 100 degrees F -pain, tingling, numbness in the hands or feet -seizures -unusually  weak or tired Side effects that usually do not require medical attention (report to your doctor or health care professional if they continue or are bothersome): -aches or pains -bruising, pain, swelling at site where injected -diarrhea -headache -loss of appetite -low-grade fever of 100 degrees F or less -nausea, vomiting -sleepy This list may not describe all possible side effects. Call your doctor for medical advice about side effects. You may report side effects to FDA at 1-800-FDA-1088. Where should I keep my medicine? This drug is given in a hospital or clinic and will not be stored at home. NOTE: This sheet is a summary. It may not cover all possible information. If you have questions about this medicine, talk to your doctor, pharmacist, or health  care provider.  2018 Elsevier/Gold Standard (2014-01-16 13:43:01)

## 2017-08-11 ENCOUNTER — Encounter: Payer: Self-pay | Admitting: Hematology & Oncology

## 2017-08-11 ENCOUNTER — Ambulatory Visit (HOSPITAL_BASED_OUTPATIENT_CLINIC_OR_DEPARTMENT_OTHER): Payer: 59 | Admitting: Hematology & Oncology

## 2017-08-11 ENCOUNTER — Other Ambulatory Visit (HOSPITAL_BASED_OUTPATIENT_CLINIC_OR_DEPARTMENT_OTHER): Payer: Commercial Managed Care - HMO

## 2017-08-11 ENCOUNTER — Other Ambulatory Visit: Payer: Self-pay

## 2017-08-11 VITALS — BP 144/83 | HR 94 | Temp 98.0°F | Resp 18 | Wt 271.0 lb

## 2017-08-11 DIAGNOSIS — D89813 Graft-versus-host disease, unspecified: Secondary | ICD-10-CM | POA: Diagnosis not present

## 2017-08-11 DIAGNOSIS — E119 Type 2 diabetes mellitus without complications: Secondary | ICD-10-CM | POA: Diagnosis not present

## 2017-08-11 DIAGNOSIS — D469 Myelodysplastic syndrome, unspecified: Secondary | ICD-10-CM

## 2017-08-11 DIAGNOSIS — F419 Anxiety disorder, unspecified: Secondary | ICD-10-CM

## 2017-08-11 LAB — CBC WITH DIFFERENTIAL (CANCER CENTER ONLY)
BASO#: 0 10*3/uL (ref 0.0–0.2)
BASO%: 0.1 % (ref 0.0–2.0)
EOS%: 2.5 % (ref 0.0–7.0)
Eosinophils Absolute: 0.2 10*3/uL (ref 0.0–0.5)
HCT: 45.1 % (ref 38.7–49.9)
HGB: 15.3 g/dL (ref 13.0–17.1)
LYMPH#: 3.4 10*3/uL — ABNORMAL HIGH (ref 0.9–3.3)
LYMPH%: 45.2 % (ref 14.0–48.0)
MCH: 30.4 pg (ref 28.0–33.4)
MCHC: 33.9 g/dL (ref 32.0–35.9)
MCV: 90 fL (ref 82–98)
MONO#: 0.7 10*3/uL (ref 0.1–0.9)
MONO%: 9.1 % (ref 0.0–13.0)
NEUT#: 3.3 10*3/uL (ref 1.5–6.5)
NEUT%: 43.1 % (ref 40.0–80.0)
PLATELETS: 178 10*3/uL (ref 145–400)
RBC: 5.04 10*6/uL (ref 4.20–5.70)
RDW: 15.2 % (ref 11.1–15.7)
WBC: 7.6 10*3/uL (ref 4.0–10.0)

## 2017-08-11 LAB — CMP (CANCER CENTER ONLY)
ALK PHOS: 92 U/L — AB (ref 26–84)
ALT: 25 U/L (ref 10–47)
AST: 27 U/L (ref 11–38)
Albumin: 3.8 g/dL (ref 3.3–5.5)
BILIRUBIN TOTAL: 0.7 mg/dL (ref 0.20–1.60)
BUN: 14 mg/dL (ref 7–22)
CO2: 27 meq/L (ref 18–33)
Calcium: 9.4 mg/dL (ref 8.0–10.3)
Chloride: 104 mEq/L (ref 98–108)
Creat: 1.3 mg/dl — ABNORMAL HIGH (ref 0.6–1.2)
GLUCOSE: 55 mg/dL — AB (ref 73–118)
Potassium: 3.9 mEq/L (ref 3.3–4.7)
SODIUM: 143 meq/L (ref 128–145)
Total Protein: 7.6 g/dL (ref 6.4–8.1)

## 2017-08-11 LAB — MAGNESIUM: Magnesium: 2.4 mg/dl (ref 1.5–2.5)

## 2017-08-11 LAB — LACTATE DEHYDROGENASE: LDH: 189 U/L (ref 125–245)

## 2017-08-11 NOTE — Progress Notes (Signed)
Hematology and Oncology Follow Up Visit  Patrick Blake [id number] Sep 16, 1971 46 y.o. 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  He has been having severe anxiety.  He has been seen by counselors.  He is supposed to see a psychologist in December.  This is been going on for a couple months.  This really has caused a lot of hardship for him.  He is just worried about his disease.  He just is not feeling like he would like.  It is only been over a year that he is had his transplant.  He had a really difficult time after the transplant.  This was a allogeneic transplant.  He just had a lot of complications which is common.  However, he is in remission.  He has maintained remission.  He is now off immunosuppressive drugs.  He is diabetic.  He is on insulin.  He is not working.  I am unsure if he will be able to work.  He was put on Klonopin and Celexa.  His wife is doing a great job with him.  She is such a blessing to have as she has really been proactive and very motivating.    He has had no fever.  He is eating well.  He has had no nausea or vomiting.  He has had no cough.  He has had no diarrhea.  He has had no rashes.   Currently, his performance status is ECOG 1.  Medications:  Current Outpatient Medications:  .  citalopram (CELEXA) 20 MG tablet, Take by mouth., Disp: , Rfl:  .  clonazePAM (KLONOPIN) 0.5 MG tablet, Take by mouth., Disp: , Rfl:  .  Insulin Syringe-Needle U-100 (INSULIN SYRINGE .3CC/31GX5/16") 31G X 5/16" 0.3 ML MISC, , Disp: , Rfl:  .  triamcinolone cream (KENALOG) 0.5 %, Apply topically., Disp: , Rfl:  .  acyclovir (ZOVIRAX) 400 MG tablet, Take 400 mg by mouth 2 (two) times daily., Disp: , Rfl:    .  amLODipine (NORVASC) 5 MG tablet, Take 5 mg by mouth daily., Disp: , Rfl:  .  cetirizine (ZYRTEC) 10 MG tablet, Take by mouth., Disp: , Rfl:  .  clobetasol cream (TEMOVATE) 9.38 %, Apply 1 application topically 2 (two) times daily as needed., Disp: , Rfl:  .  dexlansoprazole (DEXILANT) 60 MG capsule, Take by mouth daily. , Disp: , Rfl:  .  enoxaparin (LOVENOX) 150 MG/ML injection, Inject into the skin., Disp: , Rfl:  .  hydrocortisone cream 1 %, Apply 1 application topically 2 (two) times daily as needed for itching., Disp: , Rfl:  .  insulin aspart (NOVOLOG) 100 UNIT/ML injection, Inject into the skin. , Disp: , Rfl:  .  loratadine (CLARITIN) 10 MG tablet, Take 10 mg by mouth., Disp: , Rfl:  .  magnesium oxide (MAG-OX) 400 MG tablet, Take 800 mg by mouth 2 (two) times daily., Disp: , Rfl:  .  ondansetron (ZOFRAN) 8 MG tablet, Take 1 tablet (8 mg total) by mouth every 8 (eight) hours as needed for nausea or vomiting. (Patient not taking: Reported on 11/21/2016), Disp: 30 tablet, Rfl: 2 .  ONE TOUCH ULTRA TEST test strip, USE AS DIRECTED. TESTING FREQUENCY: 2-4  TIMES DAILY., Disp: , Rfl: 11 .  pioglitazone (ACTOS) 45 MG tablet, TAKE 1 TABLET BY MOUTH ONCE A DAY FOR DIABETES, Disp: , Rfl: 3 .  sulfamethoxazole-trimethoprim (BACTRIM,SEPTRA) 400-80 MG tablet, Take 1 tablet by mouth daily., Disp: , Rfl:  .  TOUJEO SOLOSTAR 300 UNIT/ML SOPN, TAKE 44 UNITS BEFORE BREAKFAST DAILY, Disp: , Rfl: 12 .  traMADol (ULTRAM) 50 MG tablet, Take 1 tablet (50 mg total) by mouth every 6 (six) hours as needed. (Patient not taking: Reported on 11/21/2016), Disp: 60 tablet, Rfl: 1 .  traZODone (DESYREL) 50 MG tablet, Take by mouth., Disp: , Rfl:   Allergies:  Allergies  Allergen Reactions  . Ace Inhibitors Other (See Comments)    unknown Unknown Unknown   . Atorvastatin Other (See Comments)    Unknown Unknown   . Hydrochlorothiazide Other (See Comments)    Lisinopril Lisinopril   .  Lisinopril-Hydrochlorothiazide Other (See Comments)    Past Medical History, Surgical history, Social history, and Family History were reviewed and updated.  Review of Systems: As stated in the interim history  Physical Exam:  weight is 271 lb (122.9 kg). His oral temperature is 98 F (36.7 C). His blood pressure is 144/83 (abnormal) and his pulse is 94. His respiration is 18 and oxygen saturation is 98%.   Wt Readings from Last 3 Encounters:  08/11/17 271 lb (122.9 kg)  11/21/16 242 lb 4 oz (109.9 kg)  08/28/16 243 lb (110.2 kg)     Obese white male in no obvious distress.  Head neck exam shows no ocular or oral lesions.  There are no palpable cervical or supra clavicular lymph nodes.  Lungs are clear bilaterally.  Cardiac exam regular rate and rhythm with no murmurs, rubs or bruits.  Abdomen is obese but soft.  He has good bowel sounds.  There is no fluid wave.  There is no palpable liver or spleen tip.  Back exam shows no tenderness over the spine, ribs or hips.  Extremities shows no clubbing, cyanosis or edema.  Neurological exam shows no focal neurological deficits.  Skin exam shows no rashes, ecchymoses or petechia.  Lab Results  Component Value Date   WBC 7.6 08/11/2017   HGB 15.3 08/11/2017   HCT 45.1 08/11/2017   MCV 90 08/11/2017   PLT 178 08/11/2017     Chemistry      Component Value Date/Time   NA 143 08/11/2017 1332   NA 140 03/31/2017 1005   K 3.9 08/11/2017 1332   K 4.3 03/31/2017 1005   CL 104 08/11/2017 1332   CO2 27 08/11/2017 1332   CO2 25 03/31/2017 1005   BUN 14 08/11/2017 1332   BUN 17.4 03/31/2017 1005   CREATININE 1.3 (H) 08/11/2017 1332   CREATININE 1.1 03/31/2017 1005      Component Value Date/Time   CALCIUM 9.4 08/11/2017 1332   CALCIUM 9.5 03/31/2017 1005   ALKPHOS 92 (H) 08/11/2017 1332   ALKPHOS 100 03/31/2017 1005   AST 27 08/11/2017 1332   AST 17 03/31/2017 1005   ALT 25 08/11/2017 1332   ALT <6 03/31/2017 1005   BILITOT 0.70  08/11/2017 1332   BILITOT 0.54 03/31/2017 1005         Impression and Plan: Patrick Blake is a 46 year old white male. He has a hybrid bone marrow disorder. He presented with incredible thrombocytosis. 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  This is probably the  best that I see him look since his transplant.   I just feel bad that he has this anxiety.  I am sure that the psychologist will be able to help him out.  From a medical point of view, he has done incredibly well.  There really is nothing that we have to do right now with him.  We will plan to see him back in 3 months.  He sees his transplant doctors at [facility] in February.  I would like to see him back after he sees his transplant physicians.     [name], MD 11/13/20184:48 PM

## 2017-08-14 ENCOUNTER — Ambulatory Visit: Payer: Commercial Managed Care - HMO | Admitting: Family

## 2017-08-14 ENCOUNTER — Other Ambulatory Visit: Payer: Commercial Managed Care - HMO

## 2017-08-14 LAB — TACROLIMUS LEVEL: Tacrolimus (FK506), Blood: NOT DETECTED ng/mL (ref 2.0–20.0)

## 2017-09-11 DIAGNOSIS — F4323 Adjustment disorder with mixed anxiety and depressed mood: Secondary | ICD-10-CM | POA: Insufficient documentation

## 2017-09-17 IMAGING — MR MR HEAD WO/W CM
8 of 10 series · 34 of 48 positions shown · IV contrast (20mL  MULTIHANCE)
Comparison: CT head without contrast 10/08/2015.

CLINICAL DATA: Right arm numbness and weakness for 2 weeks.
Intermittent right leg weakness. Essential thrombocytosis.

EXAM:
MRI HEAD WITHOUT AND WITH CONTRAST
TECHNIQUE: Multiplanar, multiecho pulse sequences of the brain and surrounding
structures were obtained without and with intravenous contrast.
CONTRAST:  20mL MULTIHANCE GADOBENATE DIMEGLUMINE 529 MG/ML IV SOLN

[Series 2: T1 · sagittal · 5.0mm · 0.45mm/px · 3 of 23 slices shown]
[im 1/23]
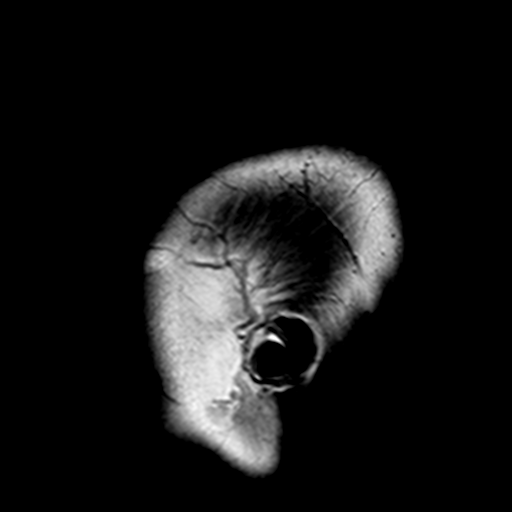
[im 12/23]
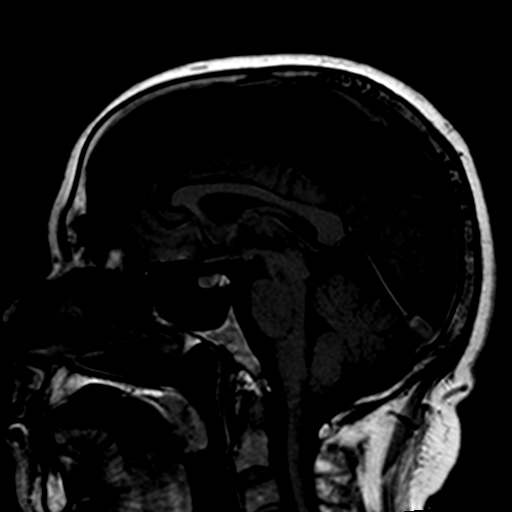
[im 23/23]
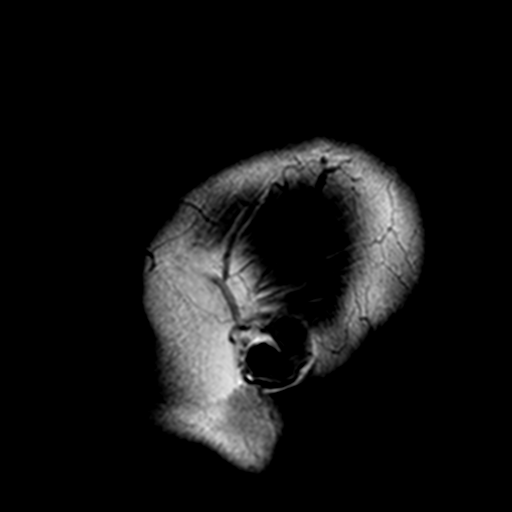

[Series 3: DWI · axial · 3.0mm · 2.19mm/px · z∈[-68,+78]mm · 11 of 90 slices shown (1 of 2)]
[im 1/90]
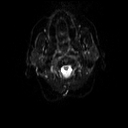
[im 9/90]
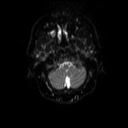
[im 18/90]
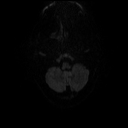
[im 27/90]
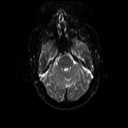
[im 36/90]
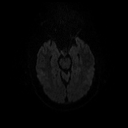
[im 45/90]
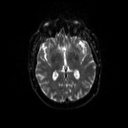
[im 54/90]
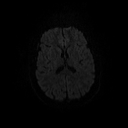
[im 63/90]
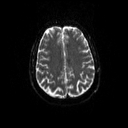
[im 72/90]
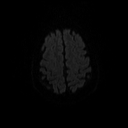
[im 81/90]
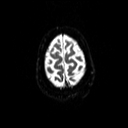
[im 90/90]
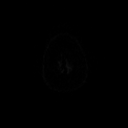

[Series 4: DWI · axial · 3.0mm · 2.19mm/px · z∈[-68,+78]mm · 5 of 45 slices shown (2 of 2)]
[im 1/45]
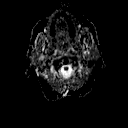
[im 12/45]
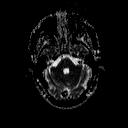
[im 23/45]
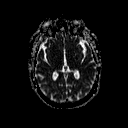
[im 34/45]
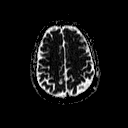
[im 45/45]
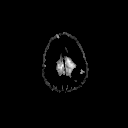

[Series 5: T2 · axial · 5.0mm · 0.45mm/px · z∈[-55,+97]mm · 3 of 23 slices shown (1 of 2)]
[im 1/23]
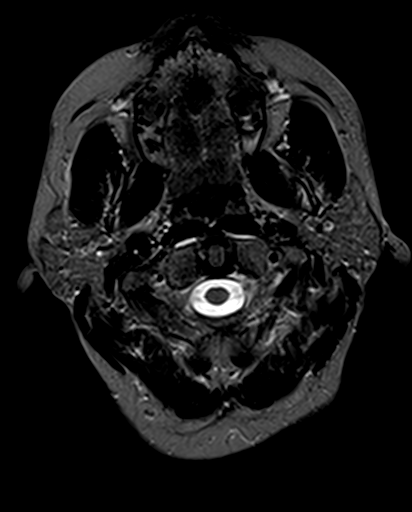
[im 12/23]
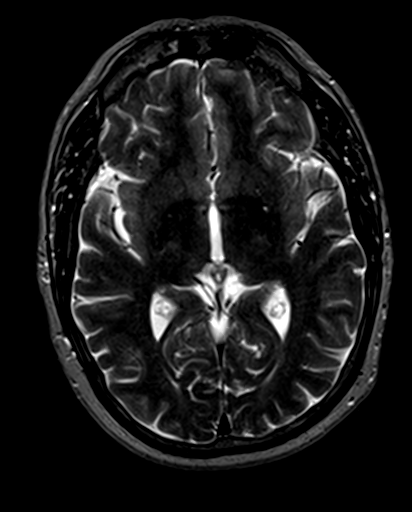
[im 23/23]
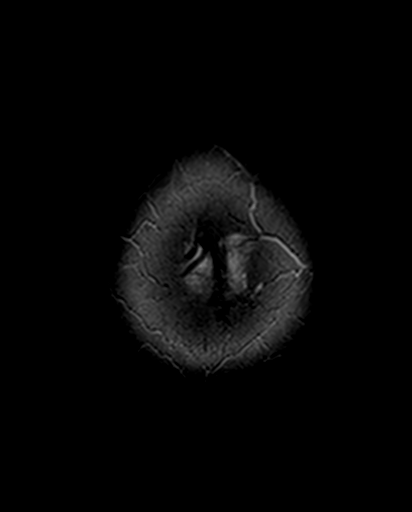

[Series 6: T2 · axial · 5.0mm · 0.45mm/px · z∈[-56,+96]mm · 3 of 23 slices shown (2 of 2)]
[im 1/23]
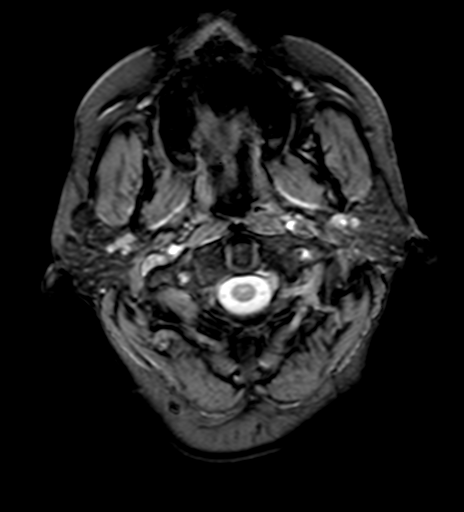
[im 12/23]
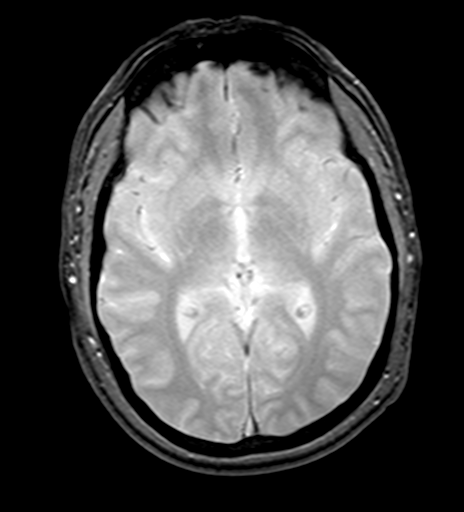
[im 23/23]
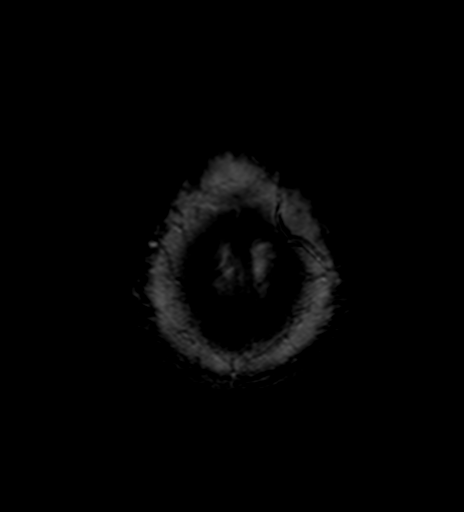

[Series 7: FLAIR · axial · 5.0mm · 0.45mm/px · z∈[-56,+96]mm · 3 of 23 slices shown]
[im 1/23]
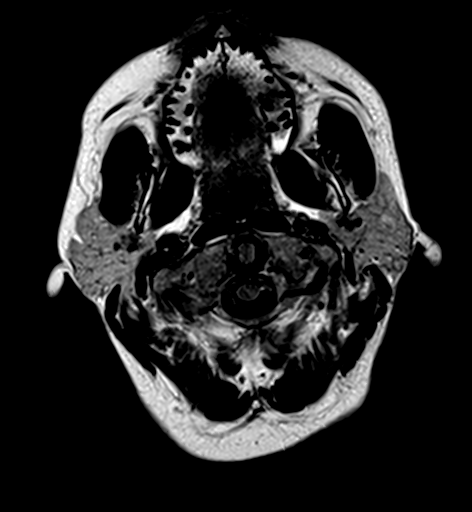
[im 12/23]
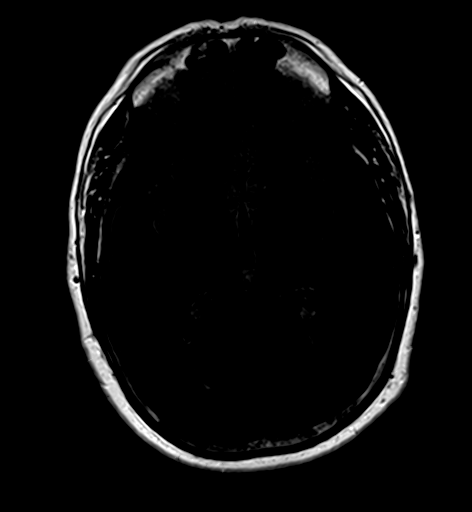
[im 23/23]
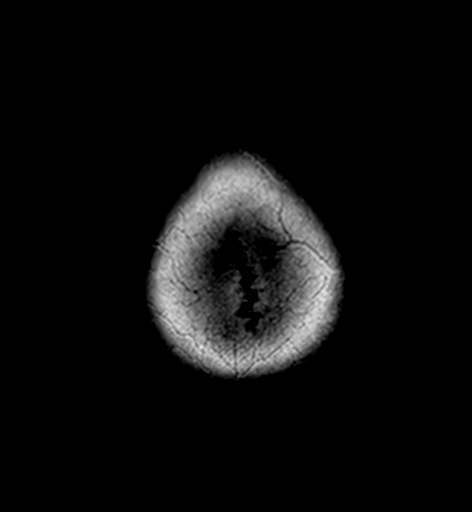

[Series 9: T2 post-contrast · coronal · 5.0mm · 0.45mm/px · 3 of 28 slices shown]
[im 1/28]
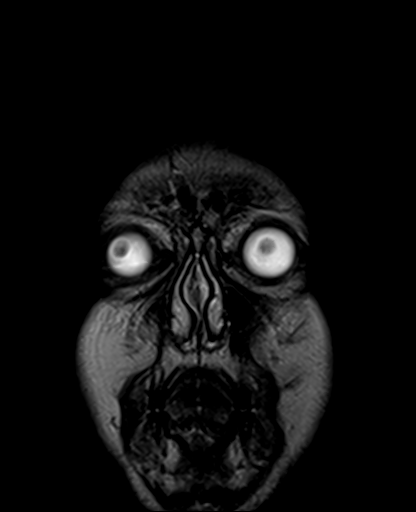
[im 14/28]
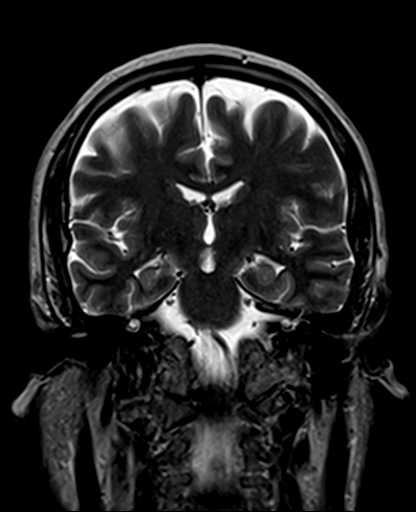
[im 28/28]
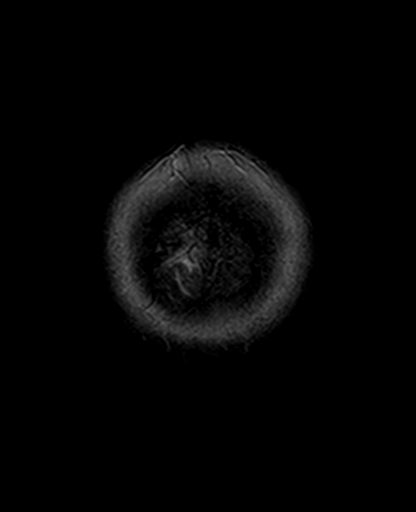

[Series 11: T1 post-contrast · coronal · 5.0mm · 0.45mm/px · 3 of 28 slices shown]
[im 1/28]
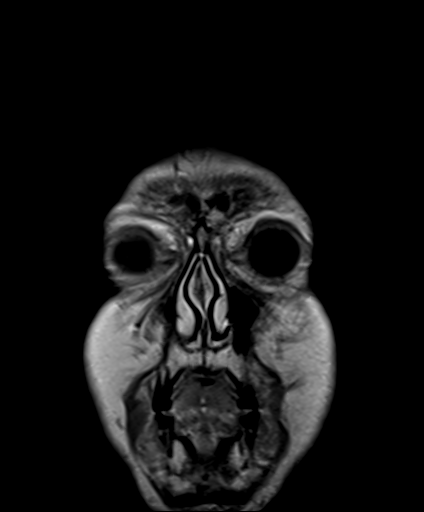
[im 14/28]
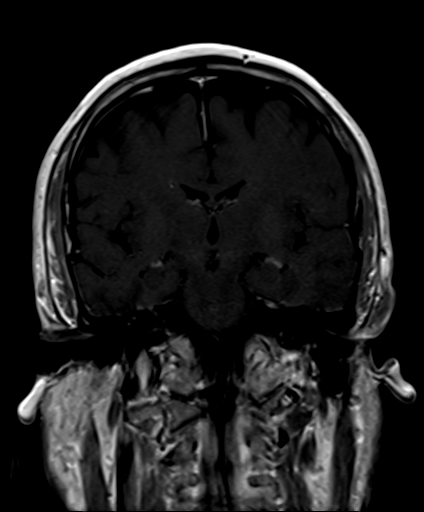
[im 28/28]
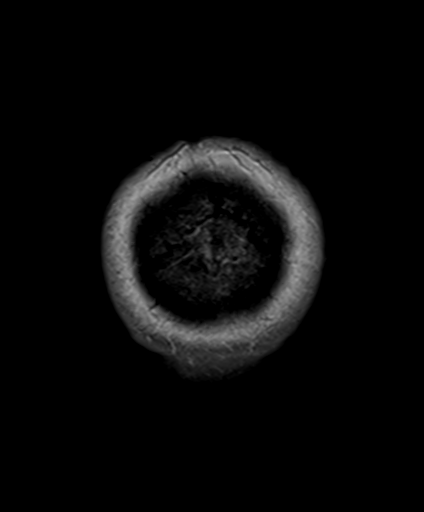

[34 of 48 positions shown; findings below may reference images not displayed]

FINDINGS: No acute infarct, hemorrhage, or mass lesion is present. The
ventricles are of normal size. No significant extraaxial fluid
collection is present.

No significant white matter disease is present. The internal
auditory canals are within normal limits bilaterally. The brainstem
and cerebellum are normal. Flow is present in the major intracranial
arteries.

The globes and orbits are intact. A polyp or mucous retention cyst
is noted posteriorly in the right maxillary sinus. The remaining
paranasal sinuses and the mastoid air cells are clear. The skullbase
is within normal limits. Midline sagittal images are unremarkable.
IMPRESSION: 1. Normal MRI of the brain for age.
2. 18 mm polyp or mucous retention cyst posteriorly within the right
maxillary sinus.

## 2017-10-08 ENCOUNTER — Telehealth: Payer: Self-pay | Admitting: *Deleted

## 2017-10-08 ENCOUNTER — Other Ambulatory Visit: Payer: Self-pay | Admitting: *Deleted

## 2017-10-08 MED ORDER — OSELTAMIVIR PHOSPHATE 75 MG PO CAPS: 75.0000 mg | ORAL_CAPSULE | Freq: Every day | ORAL | 0 refills | Status: DC

## 2017-10-08 NOTE — Telephone Encounter (Signed)
Patient wife Steffanie Dunn called stating she had just been diagnosed with the flu yesterday.  Patrick Blake today has aches, fever, cough, HA and body ache.  Dr. Marin Olp notified.  Orders for tamiflu given.  Rx sent to pharmacy

## 2017-11-12 DIAGNOSIS — F32A Depression, unspecified: Secondary | ICD-10-CM | POA: Insufficient documentation

## 2017-11-19 ENCOUNTER — Telehealth: Payer: Self-pay | Admitting: Hematology & Oncology

## 2017-11-19 NOTE — Telephone Encounter (Signed)
Faxed medical records to: [facility] CASE: [id number] 03/29/2017 to present F: [phone]     COPY SCANNED

## 2017-12-09 ENCOUNTER — Other Ambulatory Visit: Payer: Self-pay

## 2017-12-09 ENCOUNTER — Inpatient Hospital Stay: Payer: 59

## 2017-12-09 ENCOUNTER — Encounter: Payer: Self-pay | Admitting: Hematology & Oncology

## 2017-12-09 ENCOUNTER — Inpatient Hospital Stay: Payer: 59 | Attending: Hematology & Oncology | Admitting: Hematology & Oncology

## 2017-12-09 ENCOUNTER — Ambulatory Visit (HOSPITAL_BASED_OUTPATIENT_CLINIC_OR_DEPARTMENT_OTHER)
Admission: RE | Admit: 2017-12-09 | Discharge: 2017-12-09 | Disposition: A | Discharge status: Home / Self Care | Payer: 59 | Source: Ambulatory Visit | Attending: Hematology & Oncology | Admitting: Hematology & Oncology

## 2017-12-09 VITALS — BP 120/77 | HR 85 | Temp 97.8°F | Resp 17 | Wt 280.8 lb

## 2017-12-09 DIAGNOSIS — R531 Weakness: Secondary | ICD-10-CM

## 2017-12-09 DIAGNOSIS — Z79899 Other long term (current) drug therapy: Secondary | ICD-10-CM | POA: Diagnosis not present

## 2017-12-09 DIAGNOSIS — M818 Other osteoporosis without current pathological fracture: Secondary | ICD-10-CM

## 2017-12-09 DIAGNOSIS — R059 Cough, unspecified: Secondary | ICD-10-CM

## 2017-12-09 DIAGNOSIS — Z794 Long term (current) use of insulin: Secondary | ICD-10-CM | POA: Diagnosis not present

## 2017-12-09 DIAGNOSIS — M791 Myalgia, unspecified site: Secondary | ICD-10-CM | POA: Diagnosis not present

## 2017-12-09 DIAGNOSIS — K909 Intestinal malabsorption, unspecified: Secondary | ICD-10-CM

## 2017-12-09 DIAGNOSIS — M2550 Pain in unspecified joint: Secondary | ICD-10-CM | POA: Diagnosis not present

## 2017-12-09 DIAGNOSIS — Z9484 Stem cells transplant status: Secondary | ICD-10-CM | POA: Diagnosis not present

## 2017-12-09 DIAGNOSIS — R05 Cough: Secondary | ICD-10-CM | POA: Insufficient documentation

## 2017-12-09 DIAGNOSIS — F329 Major depressive disorder, single episode, unspecified: Secondary | ICD-10-CM | POA: Diagnosis not present

## 2017-12-09 DIAGNOSIS — R110 Nausea: Secondary | ICD-10-CM | POA: Diagnosis not present

## 2017-12-09 DIAGNOSIS — D46Z Other myelodysplastic syndromes: Secondary | ICD-10-CM | POA: Insufficient documentation

## 2017-12-09 DIAGNOSIS — R5383 Other fatigue: Secondary | ICD-10-CM

## 2017-12-09 DIAGNOSIS — H538 Other visual disturbances: Secondary | ICD-10-CM | POA: Insufficient documentation

## 2017-12-09 DIAGNOSIS — R002 Palpitations: Secondary | ICD-10-CM | POA: Diagnosis not present

## 2017-12-09 DIAGNOSIS — J029 Acute pharyngitis, unspecified: Secondary | ICD-10-CM

## 2017-12-09 DIAGNOSIS — D680 Von Willebrand's disease: Secondary | ICD-10-CM | POA: Diagnosis not present

## 2017-12-09 DIAGNOSIS — E291 Testicular hypofunction: Secondary | ICD-10-CM

## 2017-12-09 DIAGNOSIS — D469 Myelodysplastic syndrome, unspecified: Secondary | ICD-10-CM

## 2017-12-09 DIAGNOSIS — E119 Type 2 diabetes mellitus without complications: Secondary | ICD-10-CM | POA: Diagnosis not present

## 2017-12-09 LAB — CMP (CANCER CENTER ONLY)
ALK PHOS: 88 U/L — AB (ref 26–84)
ALT: 45 U/L (ref 10–47)
ANION GAP: 10 (ref 5–15)
AST: 35 U/L (ref 11–38)
Albumin: 4 g/dL (ref 3.5–5.0)
BUN: 11 mg/dL (ref 7–22)
CALCIUM: 9 mg/dL (ref 8.0–10.3)
CO2: 28 mmol/L (ref 18–33)
Chloride: 105 mmol/L (ref 98–108)
Creatinine: 1 mg/dL (ref 0.60–1.20)
GLUCOSE: 79 mg/dL (ref 73–118)
Potassium: 3.8 mmol/L (ref 3.3–4.7)
Sodium: 143 mmol/L (ref 128–145)
Total Bilirubin: 0.5 mg/dL (ref 0.2–1.6)
Total Protein: 7.6 g/dL (ref 6.4–8.1)

## 2017-12-09 LAB — CBC WITH DIFFERENTIAL (CANCER CENTER ONLY)
Basophils Absolute: 0 10*3/uL (ref 0.0–0.1)
Basophils Relative: 0 %
Eosinophils Absolute: 0 10*3/uL (ref 0.0–0.5)
Eosinophils Relative: 1 %
HCT: 42.9 % (ref 38.7–49.9)
HEMOGLOBIN: 14.2 g/dL (ref 13.0–17.1)
LYMPHS ABS: 2.8 10*3/uL (ref 0.9–3.3)
Lymphocytes Relative: 44 %
MCH: 30 pg (ref 28.0–33.4)
MCHC: 33.1 g/dL (ref 32.0–35.9)
MCV: 90.7 fL (ref 82.0–98.0)
MONOS PCT: 13 %
Monocytes Absolute: 0.8 10*3/uL (ref 0.1–0.9)
NEUTROS ABS: 2.6 10*3/uL (ref 1.5–6.5)
NEUTROS PCT: 42 %
Platelet Count: 158 10*3/uL (ref 145–400)
RBC: 4.73 MIL/uL (ref 4.20–5.70)
RDW: 14.4 % (ref 11.1–15.7)
WBC: 6.3 10*3/uL (ref 4.0–10.0)

## 2017-12-09 LAB — LACTATE DEHYDROGENASE: LDH: 173 U/L (ref 125–245)

## 2017-12-09 LAB — MAGNESIUM: Magnesium: 2.2 mg/dL (ref 1.5–2.5)

## 2017-12-09 NOTE — Progress Notes (Signed)
Hematology and Oncology Follow Up Visit  Patrick Blake [id number] 08-Dec-1970 47 y.o. 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  I know is been a year and a half now.  However, this is been very difficult for him to deal with.  It has really upset his life and his family.  In reality, I suspect that he has PTSD.  This is something that is not that uncommon after a transplant.  It is hard for him to concentrate.  He really cannot focus.  He has a hard time remembering.  His short-term memory is somewhat sketchy.  He is watching his blood sugars.  He has a really fancy insulin pump that he uses.  His blood sugars are monitored very closely.  He was last seen at [facility] a week or so ago.  They are watching him and adjusting medication.  He does not have a lot of stamina.  He rests all the time.  His wife said that he often can sleep most of the day.  Although he would like to work, I just do not see that he has the physical or mental capability of working and interacting with other people right now.  He is seeing a psychiatrist.  I am sure they will be able to help out.  He is coughing quite a bit.  The cough is dry.  He does seem to be worse at nighttime.  He has some sweats.  I will check a chest x-ray on him.  I will also check his testosterone levels.  He has had no rashes.  He has had some skin lesions removed.  These were skin cancer but were not melanoma.   Currently, his performance status is ECOG 2.  Medications:  Current Outpatient Medications:  .  acyclovir (ZOVIRAX) 400 MG tablet, Take 400 mg by mouth 2 (two) times daily., Disp: ,  Rfl:  .  amLODipine (NORVASC) 5 MG tablet, Take 5 mg by mouth daily., Disp: , Rfl:  .  busPIRone (BUSPAR) 15 MG tablet, Take 15 mg by mouth 3 (three) times daily., Disp: , Rfl:  .  cetirizine (ZYRTEC) 10 MG tablet, Take by mouth., Disp: , Rfl:  .  citalopram (CELEXA) 20 MG tablet, Take 40 mg by mouth. , Disp: , Rfl:  .  clobetasol cream (TEMOVATE) 2.97 %, Apply 1 application topically 2 (two) times daily as needed., Disp: , Rfl:  .  dexlansoprazole (DEXILANT) 60 MG capsule, Take by mouth daily. , Disp: , Rfl:  .  ezetimibe (ZETIA) 10 MG tablet, Take 10 mg by mouth daily., Disp: , Rfl: 3 .  hydrocortisone cream 1 %, Apply 1 application topically 2 (two) times daily as needed for itching., Disp: , Rfl:  .  insulin aspart (NOVOLOG) 100 UNIT/ML injection, Inject into the skin. , Disp: , Rfl:  .  Insulin Syringe-Needle U-100 (INSULIN SYRINGE .3CC/31GX5/16") 31G X 5/16" 0.3 ML MISC, , Disp: , Rfl:  .  loratadine (CLARITIN) 10 MG tablet, Take 10 mg by mouth., Disp: , Rfl:  .  ONE TOUCH ULTRA TEST test strip, USE AS DIRECTED. TESTING FREQUENCY: 2-4 TIMES DAILY., Disp: ,  Rfl: 11 .  oseltamivir (TAMIFLU) 75 MG capsule, Take 1 capsule (75 mg total) by mouth daily., Disp: 7 capsule, Rfl: 0 .  pioglitazone (ACTOS) 45 MG tablet, TAKE 1 TABLET BY MOUTH ONCE A DAY FOR DIABETES, Disp: , Rfl: 3 .  rosuvastatin (CRESTOR) 20 MG tablet, Take 20 mg by mouth daily., Disp: , Rfl: 3 .  sulfamethoxazole-trimethoprim (BACTRIM,SEPTRA) 400-80 MG tablet, Take 1 tablet by mouth daily., Disp: , Rfl:  .  TOUJEO SOLOSTAR 300 UNIT/ML SOPN, TAKE 44 UNITS BEFORE BREAKFAST DAILY, Disp: , Rfl: 12 .  traMADol (ULTRAM) 50 MG tablet, Take 1 tablet (50 mg total) by mouth every 6 (six) hours as needed. (Patient not taking: Reported on 11/21/2016), Disp: 60 tablet, Rfl: 1 .  traZODone (DESYREL) 50 MG tablet, Take by mouth., Disp: , Rfl:  .  triamcinolone cream (KENALOG) 0.5 %, Apply topically., Disp: , Rfl:   Allergies:  Allergies    Allergen Reactions  . Ace Inhibitors Other (See Comments)    unknown Unknown Unknown   . Atorvastatin Other (See Comments)    Unknown Unknown   . Hydrochlorothiazide Other (See Comments)    Lisinopril Lisinopril   . Lisinopril-Hydrochlorothiazide Other (See Comments)    Past Medical History, Surgical history, Social history, and Family History were reviewed and updated.  Review of Systems: Review of Systems  Constitutional: Positive for malaise/fatigue.  HENT: Positive for sore throat.   Eyes: Positive for blurred vision.  Respiratory: Positive for cough.   Cardiovascular: Positive for palpitations.  Gastrointestinal: Positive for nausea.  Genitourinary: Positive for frequency.  Musculoskeletal: Positive for joint pain and myalgias.  Neurological: Positive for dizziness, tingling and weakness.  Endo/Heme/Allergies: Bruises/bleeds easily.  Psychiatric/Behavioral: Positive for depression.     Physical Exam:  weight is 280 lb 12.8 oz (127.4 kg). His oral temperature is 97.8 F (36.6 C). His blood pressure is 120/77 and his pulse is 85. His respiration is 17 and oxygen saturation is 99%.   Wt Readings from Last 3 Encounters:  12/09/17 280 lb 12.8 oz (127.4 kg)  08/11/17 271 lb (122.9 kg)  11/21/16 242 lb 4 oz (109.9 kg)   Physical Exam  Constitutional: He is oriented to person, place, and time.  HENT:  Head: Normocephalic and atraumatic.  Mouth/Throat: Oropharynx is clear and moist.  Eyes: EOM are normal. Pupils are equal, round, and reactive to light.  Neck: Normal range of motion.  Cardiovascular: Normal rate, regular rhythm and normal heart sounds.  Pulmonary/Chest: Effort normal and breath sounds normal.  Abdominal: Soft. Bowel sounds are normal.  Musculoskeletal: Normal range of motion. He exhibits no edema, tenderness or deformity.  Lymphadenopathy:    He has no cervical adenopathy.  Neurological: He is alert and oriented to person, place, and time.   Skin: Skin is warm and dry. No rash noted. No erythema.  Psychiatric: He has a normal mood and affect. His behavior is normal. Judgment and thought content normal.  Vitals reviewed.    Lab Results  Component Value Date   WBC 6.3 12/09/2017   HGB 15.3 08/11/2017   HCT 42.9 12/09/2017   MCV 90.7 12/09/2017   PLT 158 12/09/2017     Chemistry      Component Value Date/Time   NA 143 12/09/2017 1316   NA 143 08/11/2017 1332   NA 140 03/31/2017 1005   K 3.8 12/09/2017 1316   K 3.9 08/11/2017 1332   K 4.3 03/31/2017 1005   CL 105 12/09/2017 1316   CL  104 08/11/2017 1332   CO2 28 12/09/2017 1316   CO2 27 08/11/2017 1332   CO2 25 03/31/2017 1005   BUN 11 12/09/2017 1316   BUN 14 08/11/2017 1332   BUN 17.4 03/31/2017 1005   CREATININE 1.00 12/09/2017 1316   CREATININE 1.3 (H) 08/11/2017 1332   CREATININE 1.1 03/31/2017 1005      Component Value Date/Time   CALCIUM 9.0 12/09/2017 1316   CALCIUM 9.4 08/11/2017 1332   CALCIUM 9.5 03/31/2017 1005   ALKPHOS 88 (H) 12/09/2017 1316   ALKPHOS 92 (H) 08/11/2017 1332   ALKPHOS 100 03/31/2017 1005   AST 35 12/09/2017 1316   AST 17 03/31/2017 1005   ALT 45 12/09/2017 1316   ALT 25 08/11/2017 1332   ALT <6 03/31/2017 1005   BILITOT 0.5 12/09/2017 1316   BILITOT 0.54 03/31/2017 1005         Impression and Plan: Patrick Blake is a 47 year old white male. He has a hybrid bone marrow disorder. He presented with incredible thrombocytosis. 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  Unfortunately, I just feel that he is having the emotional issues.  I know that he is trying his best.  Again, it seems like this is a form of PTSD that he is dealing with.  We will see what his chest x-ray shows.  If his testosterone level is low, then we will see about testosterone replacement therapy for him.  I would like to see him back in another 6 weeks or so.  I think that we have to  monitor him closely and try to make some adjustments with his medications if necessary to improve his quality of life.  [name], MD 3/13/20194:55 PM

## 2017-12-10 ENCOUNTER — Telehealth: Payer: Self-pay | Admitting: *Deleted

## 2017-12-10 LAB — TESTOSTERONE: TESTOSTERONE: 577 ng/dL (ref 264–916)

## 2017-12-10 LAB — VITAMIN D 25 HYDROXY (VIT D DEFICIENCY, FRACTURES): VIT D 25 HYDROXY: 50.1 ng/mL (ref 30.0–100.0)

## 2017-12-10 NOTE — Telephone Encounter (Addendum)
Patient is aware of results  ----- Message from [name], MD sent at 12/10/2017  5:59 AM EDT ----- Call - both the Vit D and testosterone level are ok!!  Patrick Blake

## 2017-12-15 ENCOUNTER — Encounter: Payer: Self-pay | Admitting: *Deleted

## 2018-02-10 ENCOUNTER — Inpatient Hospital Stay: Payer: 59 | Admitting: Hematology & Oncology

## 2018-02-10 ENCOUNTER — Inpatient Hospital Stay: Payer: 59 | Attending: Hematology & Oncology

## 2018-10-13 DIAGNOSIS — Z9289 Personal history of other medical treatment: Secondary | ICD-10-CM | POA: Insufficient documentation

## 2019-01-12 DIAGNOSIS — Z9481 Bone marrow transplant status: Secondary | ICD-10-CM | POA: Insufficient documentation

## 2019-12-28 DIAGNOSIS — M79604 Pain in right leg: Secondary | ICD-10-CM | POA: Insufficient documentation

## 2023-01-12 ENCOUNTER — Encounter: Payer: Self-pay | Admitting: Hematology & Oncology

## 2023-01-12 ENCOUNTER — Inpatient Hospital Stay: Payer: Medicare Other | Attending: Hematology & Oncology

## 2023-01-12 ENCOUNTER — Inpatient Hospital Stay (HOSPITAL_BASED_OUTPATIENT_CLINIC_OR_DEPARTMENT_OTHER): Payer: Medicare Other | Admitting: Hematology & Oncology

## 2023-01-12 VITALS — BP 144/104 | HR 102 | Temp 98.4°F | Resp 20 | Ht 73.0 in | Wt 265.0 lb

## 2023-01-12 DIAGNOSIS — D469 Myelodysplastic syndrome, unspecified: Secondary | ICD-10-CM | POA: Diagnosis not present

## 2023-01-12 DIAGNOSIS — Z79899 Other long term (current) drug therapy: Secondary | ICD-10-CM | POA: Diagnosis not present

## 2023-01-12 DIAGNOSIS — Z9484 Stem cells transplant status: Secondary | ICD-10-CM | POA: Diagnosis not present

## 2023-01-12 LAB — CBC WITH DIFFERENTIAL (CANCER CENTER ONLY)
Abs Immature Granulocytes: 0.07 10*3/uL (ref 0.00–0.07)
Basophils Absolute: 0 10*3/uL (ref 0.0–0.1)
Basophils Relative: 0 %
Eosinophils Absolute: 0.2 10*3/uL (ref 0.0–0.5)
Eosinophils Relative: 2 %
HCT: 52.1 % — ABNORMAL HIGH (ref 39.0–52.0)
Hemoglobin: 17.1 g/dL — ABNORMAL HIGH (ref 13.0–17.0)
Immature Granulocytes: 1 %
Lymphocytes Relative: 22 %
Lymphs Abs: 1.6 10*3/uL (ref 0.7–4.0)
MCH: 31.3 pg (ref 26.0–34.0)
MCHC: 32.8 g/dL (ref 30.0–36.0)
MCV: 95.4 fL (ref 80.0–100.0)
Monocytes Absolute: 1.3 10*3/uL — ABNORMAL HIGH (ref 0.1–1.0)
Monocytes Relative: 19 %
Neutro Abs: 4 10*3/uL (ref 1.7–7.7)
Neutrophils Relative %: 56 %
Platelet Count: 546 10*3/uL — ABNORMAL HIGH (ref 150–400)
RBC: 5.46 MIL/uL (ref 4.22–5.81)
RDW: 15.1 % (ref 11.5–15.5)
WBC Count: 7.1 10*3/uL (ref 4.0–10.5)
nRBC: 0 % (ref 0.0–0.2)

## 2023-01-12 LAB — RETICULOCYTES
Immature Retic Fract: 22.7 % — ABNORMAL HIGH (ref 2.3–15.9)
RBC.: 5.34 MIL/uL (ref 4.22–5.81)
Retic Count, Absolute: 106.3 10*3/uL (ref 19.0–186.0)
Retic Ct Pct: 2 % (ref 0.4–3.1)

## 2023-01-12 LAB — CMP (CANCER CENTER ONLY)
ALT: 13 U/L (ref 0–44)
AST: 14 U/L — ABNORMAL LOW (ref 15–41)
Albumin: 4.1 g/dL (ref 3.5–5.0)
Alkaline Phosphatase: 67 U/L (ref 38–126)
Anion gap: 9 (ref 5–15)
BUN: 11 mg/dL (ref 6–20)
CO2: 28 mmol/L (ref 22–32)
Calcium: 9.3 mg/dL (ref 8.9–10.3)
Chloride: 99 mmol/L (ref 98–111)
Creatinine: 1 mg/dL (ref 0.61–1.24)
GFR, Estimated: >60 mL/min (ref >60)
Glucose, Bld: 270 mg/dL — ABNORMAL HIGH (ref 70–99)
Potassium: 3.6 mmol/L (ref 3.5–5.1)
Sodium: 136 mmol/L (ref 135–145)
Total Bilirubin: 0.4 mg/dL (ref 0.3–1.2)
Total Protein: 7.1 g/dL (ref 6.5–8.1)

## 2023-01-12 LAB — FERRITIN: Ferritin: 22 ng/mL — ABNORMAL LOW (ref 24–336)

## 2023-01-12 LAB — SAVE SMEAR(SSMR), FOR PROVIDER SLIDE REVIEW

## 2023-01-12 LAB — LACTATE DEHYDROGENASE: LDH: 159 U/L (ref 98–192)

## 2023-01-12 NOTE — Progress Notes (Signed)
Hematology and Oncology Follow Up Visit  Patrick Blake [id number] 01-20-1971 52 y.o. 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  Last time we saw him was back in March 2019.  At that time, he had a stem cell transplant at [facility].  This was an allogenic transplant.  He does have time after treatment.  He basically had a complication for over a year.  Since then, he seems to have done pretty well.  He has had some skin cancer issues.  On the back of his right arm, he had a squamous of carcinoma removed.  There is some bleeding associated with this.  He also has had the squamous cell removed from his face.  Apparently, he had lab work done by his endocrinologist/family doctor.  This showed that he had an elevated hemoglobin and platelet count.  He had lab work that was done I think in March which showed a white count 7.5.  Hemoglobin 17.3.  Platelet count 4 6 6000.  MCV was 96.  He had electrolytes which are okay outside the fact that he had a elevated blood sugar.  I voiced thought that diabetes will be his biggest problem.  He has had no fever.  He has had no problems with COVID.  There is been no change in bowel or bladder habits.  He has had no weight loss or weight gain.  He has had no nausea or vomiting.  He is eating okay.  Again he is while trying to watch what he eats because of his diabetes.  There is been no pain in the hands or feet.  He has had no erythema of the hands or feet.  He has had no leg swelling.  I think there may be some neuropathy in his feet.  He, he was referred back to the [facility] because of his abnormal blood counts.  Overall, I would have said that his performance status is probably ECOG 1.    Medications:  Current Outpatient  Medications:    busPIRone (BUSPAR) 15 MG tablet, Take 15 mg by mouth 3 (three) times daily., Disp: , Rfl:    DULoxetine (CYMBALTA) 20 MG capsule, Take 20 mg by mouth daily., Disp: , Rfl:    ezetimibe (ZETIA) 10 MG tablet, Take 10 mg by mouth daily., Disp: , Rfl: 3   glucose blood test strip, USE AS DIRECTED 2-4 TIMES DAILY TO TEST BLOOD SUGARS, Disp: , Rfl:    indapamide (LOZOL) 2.5 MG tablet, Take 2.5 mg by mouth daily., Disp: , Rfl:    insulin aspart (NOVOLOG) 100 UNIT/ML injection, Inject into the skin. TID sliding scale, Disp: , Rfl:    Insulin Syringe-Needle U-100 (INSULIN SYRINGE .3CC/31GX5/16") 31G X 5/16" 0.3 ML MISC, , Disp: , Rfl:    ONE TOUCH ULTRA TEST test strip, USE AS DIRECTED. TESTING FREQUENCY: 2-4 TIMES DAILY., Disp: , Rfl: 11   pioglitazone (ACTOS) 30 MG tablet, , Disp: , Rfl: 3   pramipexole (MIRAPEX) 1.5 MG tablet, Take 1.5 mg by mouth daily., Disp: , Rfl:    rosuvastatin (CRESTOR) 20 MG tablet, Take 20 mg by mouth daily., Disp: , Rfl: 3   TOUJEO SOLOSTAR 300 UNIT/ML SOPN, 70 Units daily., Disp: , Rfl: 12  traZODone (DESYREL) 50 MG tablet, Take by mouth at bedtime as needed., Disp: , Rfl:   Allergies:  Allergies  Allergen Reactions   Ace Inhibitors Other (See Comments)    unknown Unknown Unknown    Atorvastatin Other (See Comments)    Unknown Unknown    Hydrochlorothiazide Other (See Comments)    Lisinopril Lisinopril    Lisinopril-Hydrochlorothiazide Other (See Comments)    Past Medical History, Surgical history, Social history, and Family History were reviewed and updated.  Review of Systems: Review of Systems  Constitutional:  Positive for fatigue.  HENT:  Negative.    Eyes: Negative.   Respiratory: Negative.    Cardiovascular: Negative.   Gastrointestinal: Negative.   Endocrine: Negative.   Genitourinary: Negative.    Musculoskeletal: Negative.   Skin: Negative.   Neurological: Negative.   Hematological: Negative.   Psychiatric/Behavioral:  Negative.      Physical Exam:  height is  (1.854 m) and weight is 265 lb (120.2 kg). His oral temperature is 98.4 F (36.9 C). His blood pressure is 144/104 (abnormal) and his pulse is 102 (abnormal). His respiration is 20 and oxygen saturation is 98%.   Wt Readings from Last 3 Encounters:  01/12/23 265 lb (120.2 kg)  12/09/17 280 lb 12.8 oz (127.4 kg)  08/11/17 271 lb (122.9 kg)    Physical Exam Vitals reviewed.  HENT:     Head: Normocephalic and atraumatic.  Eyes:     Pupils: Pupils are equal, round, and reactive to light.  Cardiovascular:     Rate and Rhythm: Normal rate and regular rhythm.     Heart sounds: Normal heart sounds.  Pulmonary:     Effort: Pulmonary effort is normal.     Breath sounds: Normal breath sounds.  Abdominal:     General: Bowel sounds are normal.     Palpations: Abdomen is soft.     Comments: Abdominal exam is soft.  Bowel sounds are present.  There is no fluid wave.  There is no palpable liver or spleen tip.  Musculoskeletal:        General: No tenderness or deformity. Normal range of motion.     Cervical back: Normal range of motion.  Lymphadenopathy:     Cervical: No cervical adenopathy.  Skin:    General: Skin is warm and dry.     Findings: No erythema or rash.     Comments: On the back of his right tricep, he has a surgical scar that is vertical.  This is healed.  Neurological:     Mental Status: He is alert and oriented to person, place, and time.  Psychiatric:        Behavior: Behavior normal.        Thought Content: Thought content normal.        Judgment: Judgment normal.      Lab Results  Component Value Date   WBC 7.1 01/12/2023   HGB 17.1 (H) 01/12/2023   HCT 52.1 (H) 01/12/2023   MCV 95.4 01/12/2023   PLT 546 (H) 01/12/2023     Chemistry      Component Value Date/Time   NA 136 01/12/2023 1433   NA 143 08/11/2017 1332   NA 140 03/31/2017 1005   K 3.6 01/12/2023 1433   K 3.9 08/11/2017 1332   K 4.3 03/31/2017  1005   CL 99 01/12/2023 1433   CL 104 08/11/2017 1332   CO2 28 01/12/2023 1433   CO2 27 08/11/2017 1332   CO2  25 03/31/2017 1005   BUN 11 01/12/2023 1433   BUN 14 08/11/2017 1332   BUN 17.4 03/31/2017 1005   CREATININE 1.00 01/12/2023 1433   CREATININE 1.3 (H) 08/11/2017 1332   CREATININE 1.1 03/31/2017 1005      Component Value Date/Time   CALCIUM 9.3 01/12/2023 1433   CALCIUM 9.4 08/11/2017 1332   CALCIUM 9.5 03/31/2017 1005   ALKPHOS 67 01/12/2023 1433   ALKPHOS 92 (H) 08/11/2017 1332   ALKPHOS 100 03/31/2017 1005   AST 14 (L) 01/12/2023 1433   AST 17 03/31/2017 1005   ALT 13 01/12/2023 1433   ALT 25 08/11/2017 1332   ALT <6 03/31/2017 1005   BILITOT 0.4 01/12/2023 1433   BILITOT 0.54 03/31/2017 1005     On his peripheral blood smear, he has some mild anisocytosis and poikilocytosis.  There are no nucleated red blood cells.  I see no teardrop cells.  He has no rouleaux formation.  White blood cells appear mature.  He has no hypersegmented polys.  I see no blast.  He has no immature lymphoid cells.  Platelets are increased in number.  He has several large platelets.  Platelets are well granulated.  Impression and Plan: Mr. Colombo is a very nice 52 year old white male.  We had seen him probably about 7 or 8 years ago.  At that time, he had incredibly marked thrombocytosis with a platelet count over 2 main.  He ultimately underwent an allogenic transplant.  I am little bit troubled by the fact that his blood smear is certainly does show several large platelets.  His platelet count is higher.  His hemoglobin was quite high.  His reticulocyte count is on the low side.  What is also troublesome is the fact that his ferritin is only 22 but yet he is polycythemic.  Blood sugars certainly are not helping the situation.  His LDH is better than I thought at 159.  Unfortunately, I think the way we will go notes going on is with another bone marrow test.  We will have to set this up  for him.  I would also like to get a ultrasound of the spleen to see how everything looks.  I think the real key is going to be the cytogenetics off the bone marrow.  When initially presented, he did have an abnormal chromosome.  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  I really hate this form.  I know this is quite troublesome for he and his wife.  He basically has been in remission for 7 years.  I am not sure what else could be causing the platelets to be elevated along with the erythrocytosis.  Again, I think the bone marrow biopsy will help [place].  I am also awaiting an erythropoietin level to come back.  We will try to get the bone marrow test next week if possible.  Once I get results back from the bone marrow, but more importantly the cytogenetics, we will get back in touch with him.  I will see clearly have to let his transplant doctors at [facility] know what is going on once we get the bone marrow result back.     Josph Macho, MD 4/15/20246:30 PM

## 2023-01-13 LAB — IRON AND IRON BINDING CAPACITY (CC-WL,HP ONLY)
Iron: 50 ug/dL (ref 45–182)
Saturation Ratios: 15 % — ABNORMAL LOW (ref 17.9–39.5)
TIBC: 337 ug/dL (ref 250–450)
UIBC: 287 ug/dL (ref 117–376)

## 2023-01-14 LAB — ERYTHROPOIETIN: Erythropoietin: 9 m[IU]/mL (ref 2.6–18.5)

## 2023-01-15 ENCOUNTER — Encounter: Payer: Self-pay | Admitting: *Deleted

## 2023-01-23 ENCOUNTER — Other Ambulatory Visit: Payer: Self-pay | Admitting: Radiology

## 2023-01-23 DIAGNOSIS — D469 Myelodysplastic syndrome, unspecified: Secondary | ICD-10-CM

## 2023-01-26 ENCOUNTER — Ambulatory Visit (HOSPITAL_COMMUNITY)
Admission: RE | Admit: 2023-01-26 | Discharge: 2023-01-26 | Disposition: A | Discharge status: Home / Self Care | Payer: Medicare Other | Source: Ambulatory Visit | Attending: Hematology & Oncology | Admitting: Hematology & Oncology

## 2023-01-26 ENCOUNTER — Encounter (HOSPITAL_COMMUNITY): Payer: Self-pay

## 2023-01-26 DIAGNOSIS — G4730 Sleep apnea, unspecified: Secondary | ICD-10-CM | POA: Insufficient documentation

## 2023-01-26 DIAGNOSIS — Z9484 Stem cells transplant status: Secondary | ICD-10-CM | POA: Diagnosis not present

## 2023-01-26 DIAGNOSIS — D469 Myelodysplastic syndrome, unspecified: Secondary | ICD-10-CM | POA: Diagnosis not present

## 2023-01-26 DIAGNOSIS — D75839 Thrombocytosis, unspecified: Secondary | ICD-10-CM | POA: Insufficient documentation

## 2023-01-26 DIAGNOSIS — F17200 Nicotine dependence, unspecified, uncomplicated: Secondary | ICD-10-CM | POA: Insufficient documentation

## 2023-01-26 DIAGNOSIS — E119 Type 2 diabetes mellitus without complications: Secondary | ICD-10-CM | POA: Diagnosis not present

## 2023-01-26 LAB — CBC WITH DIFFERENTIAL/PLATELET
Abs Immature Granulocytes: 0.09 10*3/uL — ABNORMAL HIGH (ref 0.00–0.07)
Basophils Absolute: 0 10*3/uL (ref 0.0–0.1)
Basophils Relative: 1 %
Eosinophils Absolute: 0.2 10*3/uL (ref 0.0–0.5)
Eosinophils Relative: 3 %
HCT: 50.2 % (ref 39.0–52.0)
Hemoglobin: 16.6 g/dL (ref 13.0–17.0)
Immature Granulocytes: 2 %
Lymphocytes Relative: 26 %
Lymphs Abs: 1.6 10*3/uL (ref 0.7–4.0)
MCH: 31.9 pg (ref 26.0–34.0)
MCHC: 33.1 g/dL (ref 30.0–36.0)
MCV: 96.5 fL (ref 80.0–100.0)
Monocytes Absolute: 1.4 10*3/uL — ABNORMAL HIGH (ref 0.1–1.0)
Monocytes Relative: 22 %
Neutro Abs: 2.9 10*3/uL (ref 1.7–7.7)
Neutrophils Relative %: 46 %
Platelets: 463 10*3/uL — ABNORMAL HIGH (ref 150–400)
RBC: 5.2 MIL/uL (ref 4.22–5.81)
RDW: 15.2 % (ref 11.5–15.5)
WBC: 6.2 10*3/uL (ref 4.0–10.5)
nRBC: 0 % (ref 0.0–0.2)

## 2023-01-26 LAB — GLUCOSE, CAPILLARY
Glucose-Capillary: 240 mg/dL — ABNORMAL HIGH (ref 70–99)
Glucose-Capillary: 190 mg/dL — ABNORMAL HIGH (ref 70–99)

## 2023-01-26 MED ORDER — FENTANYL CITRATE (PF) 100 MCG/2ML IJ SOLN
INTRAMUSCULAR | Status: AC
  Filled 2023-01-26: qty 2

## 2023-01-26 MED ORDER — SODIUM CHLORIDE 0.9 % IV SOLN: INTRAVENOUS | Status: DC

## 2023-01-26 MED ORDER — MIDAZOLAM HCL 2 MG/2ML IJ SOLN
INTRAMUSCULAR | Status: AC
  Filled 2023-01-26: qty 4

## 2023-01-26 MED ORDER — MIDAZOLAM HCL 2 MG/2ML IJ SOLN
INTRAMUSCULAR | Status: AC | PRN
  Administered 2023-01-26: 1 mg via INTRAVENOUS
  Administered 2023-01-26: 2 mg via INTRAVENOUS
  Administered 2023-01-26: 1 mg via INTRAVENOUS

## 2023-01-26 MED ORDER — FENTANYL CITRATE (PF) 100 MCG/2ML IJ SOLN
INTRAMUSCULAR | Status: AC | PRN
  Administered 2023-01-26 (×2): 50 ug via INTRAVENOUS

## 2023-01-26 MED ORDER — LIDOCAINE HCL (PF) 1 % IJ SOLN
INTRAMUSCULAR | Status: AC | PRN
  Administered 2023-01-26: 20 mL

## 2023-01-26 NOTE — Procedures (Signed)
Interventional Radiology Procedure Note  Procedure: CT BM ASP AND CORE    Complications: None  Estimated Blood Loss:  MIN  Findings: 11 G CORE AND ASP    M. TREVOR Jordyn Doane, MD    

## 2023-01-26 NOTE — Discharge Instructions (Addendum)
Bone Marrow Aspiration and Bone Marrow Biopsy, Adult, Care After  The following information offers guidance on how to care for yourself after your procedure. Your health care provider may also give you more specific instructions. If you have problems or questions, contact your health care provider.  What can I expect after the procedure?  May remove dressing or bandaid and shower tomorrow.  Keep site clean and dry. Replace with clean dressing or bandaid as necessary. Urgent needs IR clinic [phone] (mon-fri 8-5).  After the procedure, it is common to have: Mild pain and tenderness. Swelling. Bruising. Follow these instructions at home: Incision care  Follow instructions from your health care provider about how to take care of the incision site. Make sure you: Wash your hands with soap and water for at least 20 seconds before and after you change your bandage (dressing). If soap and water are not available, use hand sanitizer. Change your dressing as told by your health care provider. Leave stitches (sutures), skin glue, or adhesive strips in place. These skin closures may need to stay in place for 2 weeks or longer. If adhesive strip edges start to loosen and curl up, you may trim the loose edges. Do not remove adhesive strips completely unless your health care provider tells you to do that. Check your incision site every day for signs of infection. Check for: More redness, swelling, or pain. Fluid or blood. Warmth. Pus or a bad smell. Activity Return to your normal activities as told by your health care provider. Ask your health care provider what activities are safe for you. Do not lift anything that is heavier than 10 lb (4.5 kg), or the limit that you are told, until your health care provider says that it is safe. If you were given a sedative during the procedure, it can affect you for  several hours. Do not drive or operate machinery until your health care provider says that it is safe. General instructions  Take over-the-counter and prescription medicines only as told by your health care provider. Do not take baths, swim, or use a hot tub until your health care provider approves. Ask your health care provider if you may take showers. You may only be allowed to take sponge baths. If directed, put ice on the affected area. To do this: Put ice in a plastic bag. Place a towel between your skin and the bag. Leave the ice on for 20 minutes, 2-3 times a day. If your skin turns bright red, remove the ice right away to prevent skin damage. The risk of skin damage is higher if you cannot feel pain, heat, or cold. Contact a health care provider if: You have signs of infection. Your pain is not controlled with medicine. You have cancer, and a temperature of 100.4F (38C) or higher. Get help right away if: You have a temperature of 101F (38.3C) or higher, or as told by your health care provider. You have bleeding from the incision site that cannot be controlled. This information is not intended to replace advice given to you by your health care provider. Make sure you discuss any questions you have with your health care provider. Document Revised: 01/20/2022 Document Reviewed: 01/20/2022 Elsevier Patient Education  2023 Elsevier Inc.                            Moderate Conscious Sedation, Adult, Care After  This sheet gives you information about how to care   for yourself after your procedure. Your health care provider may also give you more specific instructions. If you have problems or questions, contact your health care provider. What can I expect after the procedure? After the procedure, it is common to have: Sleepiness for several hours. Impaired judgment for several hours. Difficulty with balance. Vomiting if you eat too soon. Follow these instructions at home: For  the time period you were told by your health care provider:     Rest. Do not participate in activities where you could fall or become injured. Do not drive or use machinery. Do not drink alcohol. Do not take sleeping pills or medicines that cause drowsiness. Do not make important decisions or sign legal documents. Do not take care of children on your own. Eating and drinking  Follow the diet recommended by your health care provider. Drink enough fluid to keep your urine pale yellow. If you vomit: Drink water, juice, or soup when you can drink without vomiting. Make sure you have little or no nausea before eating solid foods. General instructions Take over-the-counter and prescription medicines only as told by your health care provider. Have a responsible adult stay with you for the time you are told. It is important to have someone help care for you until you are awake and alert. Do not smoke. Keep all follow-up visits as told by your health care provider. This is important. Contact a health care provider if: You are still sleepy or having trouble with balance after 24 hours. You feel light-headed. You keep feeling nauseous or you keep vomiting. You develop a rash. You have a fever. You have redness or swelling around the IV site. Get help right away if: You have trouble breathing. You have new-onset confusion at home. Summary After the procedure, it is common to feel sleepy, have impaired judgment, or feel nauseous if you eat too soon. Rest after you get home. Know the things you should not do after the procedure. Follow the diet recommended by your health care provider and drink enough fluid to keep your urine pale yellow. Get help right away if you have trouble breathing or new-onset confusion at home. This information is not intended to replace advice given to you by your health care provider. Make sure you discuss any questions you have with your health care  provider. Document Revised: 01/13/2020 Document Reviewed: 08/11/2019 Elsevier Patient Education  2023 Elsevier Inc.  

## 2023-01-26 NOTE — H&P (Signed)
Referring Physician(s): Ennever,Peter R  Supervising Physician: Ruel Favors  Patient Status:  [facility]  Chief Complaint:  "I'm having another bone marrow biopsy"  Subjective: Pt known to IR team from bone marrow biopsy in 2017. He has a PMH sig for DM, sleep apnea,  thrombocytosis/erythrocytosis, stem cell transplant 2017 who presents today for image guided bone marrow biopsy to assess for possible recurrent MPN/MDS. He denies fever,HA,CP,dyspnea, cough, abd/back pain,N/V or bleeding.   Past Medical History:  Diagnosis Date   Diabetes mellitus without complication (HCC)    MDS/MPN (myelodysplastic/myeloproliferative neoplasms) (HCC) 01/18/2016   MPN (myeloproliferative neoplasm) (HCC) 09/20/2015   Sleep apnea    loud snore   Thrombocytosis 09/20/2015   Past Surgical History:  Procedure Laterality Date   ROTATOR CUFF REPAIR Left 2006      Allergies: Ace inhibitors, Atorvastatin, Hydrochlorothiazide, and Lisinopril-hydrochlorothiazide  Medications: Prior to Admission medications   Medication Sig Start Date End Date Taking? Authorizing Provider  busPIRone (BUSPAR) 15 MG tablet Take 15 mg by mouth 3 (three) times daily. 12/02/17  Yes [provider]  DULoxetine (CYMBALTA) 20 MG capsule Take 20 mg by mouth daily.   Yes [provider]  ezetimibe (ZETIA) 10 MG tablet Take 10 mg by mouth daily. 11/13/17  Yes [provider]  glucose blood test strip USE AS DIRECTED 2-4 TIMES DAILY TO TEST BLOOD SUGARS 02/25/17  Yes [provider]  indapamide (LOZOL) 2.5 MG tablet Take 2.5 mg by mouth daily.   Yes [provider]  insulin aspart (NOVOLOG) 100 UNIT/ML injection Inject into the skin. TID sliding scale   Yes [provider]  Insulin Syringe-Needle U-100 (INSULIN SYRINGE .3CC/31GX5/16") 31G X 5/16" 0.3 ML MISC  11/23/12  Yes [provider]  ONE TOUCH ULTRA TEST test strip USE AS DIRECTED. TESTING FREQUENCY: 2-4 TIMES  DAILY. 08/24/15  Yes [provider]  pioglitazone (ACTOS) 30 MG tablet  07/17/17  Yes [provider]  pramipexole (MIRAPEX) 1.5 MG tablet Take 1.5 mg by mouth daily.   Yes [provider]  rosuvastatin (CRESTOR) 20 MG tablet Take 20 mg by mouth daily. 11/13/17  Yes [provider]  TOUJEO SOLOSTAR 300 UNIT/ML SOPN 70 Units daily. 08/26/15  Yes [provider]  traZODone (DESYREL) 50 MG tablet Take by mouth at bedtime as needed. 10/02/16  Yes [provider]     Vital Signs: BP 114/81   Pulse 77   Temp 98 F (36.7 C) (Oral)   Resp 20   Ht 6\' 1"  (1.854 m)   Wt 265 lb (120.2 kg)   SpO2 95%   BMI 34.96 kg/m   Physical Exam: awake/alert; chest- CTA bilat; heart- RRR; abd- soft,+BS,NT; no LE edema    Code Status: FULL CODE  Imaging: No results found.  Labs:  CBC: Recent Labs    01/12/23 1433  WBC 7.1  HGB 17.1*  HCT 52.1*  PLT 546*    COAGS: No results for input(s): "INR", "APTT" in the last 8760 hours.  BMP: Recent Labs    01/12/23 1433  NA 136  K 3.6  CL 99  CO2 28  GLUCOSE 270*  BUN 11  CALCIUM 9.3  CREATININE 1.00  GFRNONAA >60    LIVER FUNCTION TESTS: Recent Labs    01/12/23 1433  BILITOT 0.4  AST 14*  ALT 13  ALKPHOS 67  PROT 7.1  ALBUMIN 4.1    Assessment and Plan: 52 yo male smoker with PMH sig for  DM, sleep apnea,  thrombocytosis/erythrocytosis, stem cell transplant 2017 who presents today for image guided bone marrow biopsy to assess for possible recurrent MPN/MDS.Risks and benefits of procedure was discussed with the patient including, but not limited to bleeding, infection, damage to adjacent structures or low yield requiring additional tests.  All of the questions were answered and there is agreement to proceed.  Consent signed and in chart.    Electronically Signed: D. Jeananne Rama, PA-C 01/26/2023, 8:10 AM   I spent a total of 20 minutes at the the patient's bedside AND  on the patient's hospital floor or unit, greater than 50% of which was counseling/coordinating care for image guided bone marrow biopsy

## 2023-01-27 LAB — SURGICAL PATHOLOGY

## 2023-01-28 ENCOUNTER — Other Ambulatory Visit: Payer: Self-pay | Admitting: Hematology & Oncology

## 2023-01-28 MED ORDER — FUSION PLUS PO CAPS: 1.0000 | ORAL_CAPSULE | Freq: Every day | ORAL | 6 refills | Status: DC

## 2023-01-28 NOTE — Progress Notes (Signed)
I called his wife regarding the bone marrow biopsy result.  I really was pleased with the result.  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  He does have some thrombocytosis.  However, he does have some iron deficiency.  His iron saturation was only 15%.  I think that for right now, we can just have him on some oral iron.  I called in Fusion Plus to his pharmacy.  He will take this daily.  I told his wife that I would probably see him back in about 4 weeks.  At that point, we will check his CBC and iron levels and hopefully we will find that his platelet count is improving.  Of note, his platelet count was just done 2 days ago was down to 463K.  This continues to improve.  I am just so happy about this.  Overall, I just do not think that he has a recurrence of his myeloproliferative neoplasm.  Christin Bach, MD

## 2023-01-29 ENCOUNTER — Telehealth: Payer: Self-pay | Admitting: *Deleted

## 2023-01-29 NOTE — Telephone Encounter (Signed)
Per scheduling message Erie Noe - called patient and lvm of upcoming appointment - requested callback to confirm.

## 2023-02-03 ENCOUNTER — Encounter (HOSPITAL_COMMUNITY): Payer: Self-pay | Admitting: Hematology & Oncology

## 2023-02-25 ENCOUNTER — Other Ambulatory Visit: Payer: Self-pay | Admitting: *Deleted

## 2023-02-25 DIAGNOSIS — D508 Other iron deficiency anemias: Secondary | ICD-10-CM

## 2023-02-25 DIAGNOSIS — D469 Myelodysplastic syndrome, unspecified: Secondary | ICD-10-CM

## 2023-02-26 ENCOUNTER — Inpatient Hospital Stay: Payer: Medicare Other | Attending: Hematology & Oncology

## 2023-02-26 ENCOUNTER — Inpatient Hospital Stay: Payer: Medicare Other | Admitting: Hematology & Oncology

## 2023-02-26 ENCOUNTER — Other Ambulatory Visit: Payer: Self-pay

## 2023-02-26 ENCOUNTER — Encounter: Payer: Self-pay | Admitting: Hematology & Oncology

## 2023-02-26 VITALS — BP 110/74 | HR 77 | Temp 98.6°F | Resp 16 | Ht 73.0 in | Wt 270.0 lb

## 2023-02-26 DIAGNOSIS — D469 Myelodysplastic syndrome, unspecified: Secondary | ICD-10-CM | POA: Diagnosis not present

## 2023-02-26 DIAGNOSIS — Z79899 Other long term (current) drug therapy: Secondary | ICD-10-CM | POA: Diagnosis not present

## 2023-02-26 DIAGNOSIS — Z7984 Long term (current) use of oral hypoglycemic drugs: Secondary | ICD-10-CM | POA: Insufficient documentation

## 2023-02-26 DIAGNOSIS — Z9484 Stem cells transplant status: Secondary | ICD-10-CM | POA: Insufficient documentation

## 2023-02-26 DIAGNOSIS — D508 Other iron deficiency anemias: Secondary | ICD-10-CM

## 2023-02-26 LAB — CBC WITH DIFFERENTIAL (CANCER CENTER ONLY)
Abs Immature Granulocytes: 0.11 10*3/uL — ABNORMAL HIGH (ref 0.00–0.07)
Basophils Absolute: 0 10*3/uL (ref 0.0–0.1)
Basophils Relative: 0 %
Eosinophils Absolute: 0.2 10*3/uL (ref 0.0–0.5)
Eosinophils Relative: 2 %
HCT: 51.7 % (ref 39.0–52.0)
Hemoglobin: 16.9 g/dL (ref 13.0–17.0)
Immature Granulocytes: 2 %
Lymphocytes Relative: 28 %
Lymphs Abs: 1.9 10*3/uL (ref 0.7–4.0)
MCH: 31.8 pg (ref 26.0–34.0)
MCHC: 32.7 g/dL (ref 30.0–36.0)
MCV: 97.2 fL (ref 80.0–100.0)
Monocytes Absolute: 1.3 10*3/uL — ABNORMAL HIGH (ref 0.1–1.0)
Monocytes Relative: 19 %
Neutro Abs: 3.3 10*3/uL (ref 1.7–7.7)
Neutrophils Relative %: 49 %
Platelet Count: 554 10*3/uL — ABNORMAL HIGH (ref 150–400)
RBC: 5.32 MIL/uL (ref 4.22–5.81)
RDW: 16.4 % — ABNORMAL HIGH (ref 11.5–15.5)
WBC Count: 6.9 10*3/uL (ref 4.0–10.5)
nRBC: 0 % (ref 0.0–0.2)

## 2023-02-26 LAB — CMP (CANCER CENTER ONLY)
ALT: 18 U/L (ref 0–44)
AST: 17 U/L (ref 15–41)
Albumin: 4.5 g/dL (ref 3.5–5.0)
Alkaline Phosphatase: 61 U/L (ref 38–126)
Anion gap: 9 (ref 5–15)
BUN: 12 mg/dL (ref 6–20)
CO2: 32 mmol/L (ref 22–32)
Calcium: 9.6 mg/dL (ref 8.9–10.3)
Chloride: 97 mmol/L — ABNORMAL LOW (ref 98–111)
Creatinine: 0.99 mg/dL (ref 0.61–1.24)
GFR, Estimated: >60 mL/min (ref >60)
Glucose, Bld: 265 mg/dL — ABNORMAL HIGH (ref 70–99)
Potassium: 3.6 mmol/L (ref 3.5–5.1)
Sodium: 138 mmol/L (ref 135–145)
Total Bilirubin: 0.5 mg/dL (ref 0.3–1.2)
Total Protein: 7.3 g/dL (ref 6.5–8.1)

## 2023-02-26 LAB — IRON AND IRON BINDING CAPACITY (CC-WL,HP ONLY)
Iron: 144 ug/dL (ref 45–182)
Saturation Ratios: 45 % — ABNORMAL HIGH (ref 17.9–39.5)
TIBC: 319 ug/dL (ref 250–450)
UIBC: 175 ug/dL (ref 117–376)

## 2023-02-26 LAB — RETICULOCYTES
Immature Retic Fract: 23.5 % — ABNORMAL HIGH (ref 2.3–15.9)
RBC.: 5.24 MIL/uL (ref 4.22–5.81)
Retic Count, Absolute: 125.8 10*3/uL (ref 19.0–186.0)
Retic Ct Pct: 2.4 % (ref 0.4–3.1)

## 2023-02-26 LAB — LACTATE DEHYDROGENASE: LDH: 164 U/L (ref 98–192)

## 2023-02-26 LAB — FERRITIN: Ferritin: 21 ng/mL — ABNORMAL LOW (ref 24–336)

## 2023-02-26 NOTE — Progress Notes (Signed)
Hematology and Oncology Follow Up Visit  Patrick Blake [id number] March 06, 1971 52 y.o. 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  We did go ahead and do a bone marrow biopsy on him.  This was done on 01/26/2023.  The pathology report ([id number]) showed a normocellular bone marrow with megakaryocytic hyperplasia.  There is some atypical megakaryocytes.  There is no blast.  He did have adequate iron studies.  He had many small and hypolobulated megakaryocytes.  I think what really troubles me however is the fact that we will get the cytogenetics back, his chromosome abnormalities back.  When he first presented, he had a duplicated 1q chromosome.  This has returned.  When have to speak to Patrick Blake at [facility], who was his transplant doctor.  We will have to see what he has to say about this.  I did put him on some oral iron because his iron levels were on the lower side.  We will see what his iron levels look like.  He otherwise seems to be doing pretty well.  His blood sugars are still quite high.  He and his family went to the beach over the [place] weekend.  He has had no fever.  He has had no cough or shortness of breath.  There is been no change in bowel or bladder habits.  He has had no rashes.  There is been no bleeding.  Overall, I would say his performance status is probably ECOG 0.  .  Medications:  Current Outpatient Medications:    DULoxetine (CYMBALTA) 30 MG capsule, Take 30 mg by mouth 2 (two) times daily., Disp: , Rfl:    busPIRone (BUSPAR) 15 MG tablet, Take 15 mg by mouth 3 (three) times daily., Disp: , Rfl:    ezetimibe (ZETIA) 10 MG tablet, Take 10 mg by mouth daily., Disp: , Rfl: 3   glucose blood test strip, USE AS DIRECTED 2-4 TIMES DAILY TO TEST BLOOD SUGARS, Disp: ,  Rfl:    indapamide (LOZOL) 2.5 MG tablet, Take 2.5 mg by mouth daily., Disp: , Rfl:    insulin aspart (NOVOLOG) 100 UNIT/ML injection, Inject into the skin. TID sliding scale, Disp: , Rfl:    Insulin Syringe-Needle U-100 (INSULIN SYRINGE .3CC/31GX5/16") 31G X 5/16" 0.3 ML MISC, , Disp: , Rfl:    Iron-FA-B Cmp-C-Biot-Probiotic (FUSION PLUS) CAPS, Take 1 capsule by mouth daily after breakfast., Disp: 30 capsule, Rfl: 6   ONE TOUCH ULTRA TEST test strip, USE AS DIRECTED. TESTING FREQUENCY: 2-4 TIMES DAILY., Disp: , Rfl: 11   pioglitazone (ACTOS) 30 MG tablet, , Disp: , Rfl: 3   pramipexole (MIRAPEX) 1.5 MG tablet, Take 1.5 mg by mouth daily., Disp: , Rfl:    rosuvastatin (CRESTOR) 20 MG tablet, Take 20 mg by mouth daily., Disp: , Rfl: 3   TOUJEO SOLOSTAR 300 UNIT/ML SOPN, 70 Units daily., Disp: , Rfl: 12   traZODone (DESYREL) 50 MG tablet, Take by mouth at bedtime as needed., Disp: , Rfl:   Allergies:  Allergies  Allergen Reactions   Ace Inhibitors Other (See Comments)    unknown Unknown Unknown    Atorvastatin Other (See Comments)    Unknown Unknown    Hydrochlorothiazide Other (See Comments)    Lisinopril Lisinopril    Lisinopril-Hydrochlorothiazide  Other (See Comments)    Past Medical History, Surgical history, Social history, and Family History were reviewed and updated.  Review of Systems: Review of Systems  Constitutional:  Positive for fatigue.  HENT:  Negative.    Eyes: Negative.   Respiratory: Negative.    Cardiovascular: Negative.   Gastrointestinal: Negative.   Endocrine: Negative.   Genitourinary: Negative.    Musculoskeletal: Negative.   Skin: Negative.   Neurological: Negative.   Hematological: Negative.   Psychiatric/Behavioral: Negative.      Physical Exam:  height is 6\' 1"  (1.854 m) and weight is 270 lb (122.5 kg). His oral temperature is 98.6 F (37 C). His blood pressure is 110/74 and his pulse is 77. His respiration is 16 and oxygen saturation is  98%.   Wt Readings from Last 3 Encounters:  02/26/23 270 lb (122.5 kg)  01/26/23 265 lb (120.2 kg)  01/12/23 265 lb (120.2 kg)    Physical Exam Vitals reviewed.  HENT:     Head: Normocephalic and atraumatic.  Eyes:     Pupils: Pupils are equal, round, and reactive to light.  Cardiovascular:     Rate and Rhythm: Normal rate and regular rhythm.     Heart sounds: Normal heart sounds.  Pulmonary:     Effort: Pulmonary effort is normal.     Breath sounds: Normal breath sounds.  Abdominal:     General: Bowel sounds are normal.     Palpations: Abdomen is soft.     Comments: Abdominal exam is soft.  Bowel sounds are present.  There is no fluid wave.  There is no palpable liver or spleen tip.  Musculoskeletal:        General: No tenderness or deformity. Normal range of motion.     Cervical back: Normal range of motion.  Lymphadenopathy:     Cervical: No cervical adenopathy.  Skin:    General: Skin is warm and dry.     Findings: No erythema or rash.     Comments: On the back of his right tricep, he has a surgical scar that is vertical.  This is healed.  Neurological:     Mental Status: He is alert and oriented to person, place, and time.  Psychiatric:        Behavior: Behavior normal.        Thought Content: Thought content normal.        Judgment: Judgment normal.      Lab Results  Component Value Date   WBC 6.9 02/26/2023   HGB 16.9 02/26/2023   HCT 51.7 02/26/2023   MCV 97.2 02/26/2023   PLT 554 (H) 02/26/2023     Chemistry      Component Value Date/Time   NA 138 02/26/2023 0830   NA 143 08/11/2017 1332   NA 140 03/31/2017 1005   K 3.6 02/26/2023 0830   K 3.9 08/11/2017 1332   K 4.3 03/31/2017 1005   CL 97 (L) 02/26/2023 0830   CL 104 08/11/2017 1332   CO2 32 02/26/2023 0830   CO2 27 08/11/2017 1332   CO2 25 03/31/2017 1005   BUN 12 02/26/2023 0830   BUN 14 08/11/2017 1332   BUN 17.4 03/31/2017 1005   CREATININE 0.99 02/26/2023 0830   CREATININE 1.3 (H)  08/11/2017 1332   CREATININE 1.1 03/31/2017 1005      Component Value Date/Time   CALCIUM 9.6 02/26/2023 0830   CALCIUM 9.4 08/11/2017 1332   CALCIUM 9.5 03/31/2017 1005   ALKPHOS  61 02/26/2023 0830   ALKPHOS 92 (H) 08/11/2017 1332   ALKPHOS 100 03/31/2017 1005   AST 17 02/26/2023 0830   AST 17 03/31/2017 1005   ALT 18 02/26/2023 0830   ALT 25 08/11/2017 1332   ALT <6 03/31/2017 1005   BILITOT 0.5 02/26/2023 0830   BILITOT 0.54 03/31/2017 1005     On his peripheral blood smear, he has some mild anisocytosis and poikilocytosis.  There are no nucleated red blood cells.  I see no teardrop cells.  He has no rouleaux formation.  White blood cells appear mature.  He has no hypersegmented polys.  I see no blast.  He has no immature lymphoid cells.  Platelets are increased in number.  He has several large platelets.  Platelets are well granulated.  Impression and Plan: Mr. Aresco is a very nice 52 year old white male.  We had seen him probably about 7 or 8 years ago.  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  He ultimately underwent an allogenic transplant.  Again, I am followed by the chromosome that is in the bone marrow.  I really think this is going to "drive" our decisions.  I would think that a second transplant might be needed.  I know that we do have some newer medications that are out there that might be able to help.  His platelet count certainly is not all that high right now so we have a lot of flexibility.  Will get have to watch him very closely.  I will plan to have him come back in about 3-4 weeks.    Josph Macho, MD 5/30/20249:44 AM

## 2023-03-17 ENCOUNTER — Encounter (HOSPITAL_COMMUNITY): Payer: Self-pay | Admitting: Hematology & Oncology

## 2023-03-27 ENCOUNTER — Other Ambulatory Visit: Payer: Self-pay

## 2023-03-27 ENCOUNTER — Encounter: Payer: Self-pay | Admitting: *Deleted

## 2023-03-27 ENCOUNTER — Inpatient Hospital Stay: Payer: Medicare Other | Attending: Hematology & Oncology

## 2023-03-27 ENCOUNTER — Inpatient Hospital Stay: Payer: Medicare Other | Admitting: Hematology & Oncology

## 2023-03-27 ENCOUNTER — Encounter: Payer: Self-pay | Admitting: Hematology & Oncology

## 2023-03-27 VITALS — BP 105/72 | HR 82 | Temp 98.5°F | Resp 18 | Ht 73.0 in | Wt 273.4 lb

## 2023-03-27 DIAGNOSIS — R718 Other abnormality of red blood cells: Secondary | ICD-10-CM | POA: Insufficient documentation

## 2023-03-27 DIAGNOSIS — D469 Myelodysplastic syndrome, unspecified: Secondary | ICD-10-CM | POA: Insufficient documentation

## 2023-03-27 DIAGNOSIS — Z794 Long term (current) use of insulin: Secondary | ICD-10-CM | POA: Diagnosis not present

## 2023-03-27 DIAGNOSIS — Z9484 Stem cells transplant status: Secondary | ICD-10-CM | POA: Insufficient documentation

## 2023-03-27 DIAGNOSIS — Z79899 Other long term (current) drug therapy: Secondary | ICD-10-CM | POA: Insufficient documentation

## 2023-03-27 DIAGNOSIS — E119 Type 2 diabetes mellitus without complications: Secondary | ICD-10-CM | POA: Diagnosis not present

## 2023-03-27 DIAGNOSIS — Z7984 Long term (current) use of oral hypoglycemic drugs: Secondary | ICD-10-CM | POA: Diagnosis not present

## 2023-03-27 LAB — CBC WITH DIFFERENTIAL (CANCER CENTER ONLY)
Abs Immature Granulocytes: 0.09 10*3/uL — ABNORMAL HIGH (ref 0.00–0.07)
Basophils Absolute: 0 10*3/uL (ref 0.0–0.1)
Basophils Relative: 0 %
Eosinophils Absolute: 0.1 10*3/uL (ref 0.0–0.5)
Eosinophils Relative: 1 %
HCT: 51.2 % (ref 39.0–52.0)
Hemoglobin: 16.9 g/dL (ref 13.0–17.0)
Immature Granulocytes: 1 %
Lymphocytes Relative: 21 %
Lymphs Abs: 1.7 10*3/uL (ref 0.7–4.0)
MCH: 32.4 pg (ref 26.0–34.0)
MCHC: 33 g/dL (ref 30.0–36.0)
MCV: 98.1 fL (ref 80.0–100.0)
Monocytes Absolute: 2 10*3/uL — ABNORMAL HIGH (ref 0.1–1.0)
Monocytes Relative: 24 %
Neutro Abs: 4.5 10*3/uL (ref 1.7–7.7)
Neutrophils Relative %: 53 %
Platelet Count: 581 10*3/uL — ABNORMAL HIGH (ref 150–400)
RBC: 5.22 MIL/uL (ref 4.22–5.81)
RDW: 17.1 % — ABNORMAL HIGH (ref 11.5–15.5)
WBC Count: 8.4 10*3/uL (ref 4.0–10.5)
nRBC: 0 % (ref 0.0–0.2)

## 2023-03-27 LAB — IRON AND IRON BINDING CAPACITY (CC-WL,HP ONLY)
Iron: 116 ug/dL (ref 45–182)
Saturation Ratios: 34 % (ref 17.9–39.5)
TIBC: 339 ug/dL (ref 250–450)
UIBC: 223 ug/dL (ref 117–376)

## 2023-03-27 LAB — CMP (CANCER CENTER ONLY)
ALT: 17 U/L (ref 0–44)
AST: 18 U/L (ref 15–41)
Albumin: 4.5 g/dL (ref 3.5–5.0)
Alkaline Phosphatase: 59 U/L (ref 38–126)
Anion gap: 7 (ref 5–15)
BUN: 18 mg/dL (ref 6–20)
CO2: 32 mmol/L (ref 22–32)
Calcium: 9.7 mg/dL (ref 8.9–10.3)
Chloride: 100 mmol/L (ref 98–111)
Creatinine: 1.2 mg/dL (ref 0.61–1.24)
GFR, Estimated: >60 mL/min (ref >60)
Glucose, Bld: 217 mg/dL — ABNORMAL HIGH (ref 70–99)
Potassium: 3.9 mmol/L (ref 3.5–5.1)
Sodium: 139 mmol/L (ref 135–145)
Total Bilirubin: 0.6 mg/dL (ref 0.3–1.2)
Total Protein: 7.2 g/dL (ref 6.5–8.1)

## 2023-03-27 LAB — FERRITIN: Ferritin: 30 ng/mL (ref 24–336)

## 2023-03-27 LAB — LACTATE DEHYDROGENASE: LDH: 177 U/L (ref 98–192)

## 2023-03-27 NOTE — Progress Notes (Signed)
Hematology and Oncology Follow Up Visit  Patrick Blake [id number] Jun 03, 1971 52 y.o. 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  Unfortunately, he will need to have another transplant.  He saw Dr. Teola Bradley at [facility].  Shockingly, blood work was done which showed that all the blood cells were Patrick Blake.  Really surprised by this.  I cannot make the last time that I have heard of this happening.  I suspect that the fact that he had relapse of the  1q chromosome was a indicator of his stem cells recurring.  There are donors available.  However, Dr. Teola Bradley wants to just watch for right now.  We will have him come back monthly for lab work.  He feels well.  He has had no complaints.  His biggest problem right now is his diabetes.  His blood sugars GIST are not that well-controlled.  Hopefully, there will be under better control in the future.  He has had no bleeding.  There is been no bruising.  He has had no fever.  He has had no cough or shortness of breath.  Overall, I would say that his performance status is probably ECOG 1.    Medications:  Current Outpatient Medications:    busPIRone (BUSPAR) 15 MG tablet, Take 15 mg by mouth 3 (three) times daily., Disp: , Rfl:    DULoxetine (CYMBALTA) 30 MG capsule, Take 30 mg by mouth 2 (two) times daily., Disp: , Rfl:    ezetimibe (ZETIA) 10 MG tablet, Take 10 mg by mouth daily., Disp: , Rfl: 3   glucose blood test strip, USE AS DIRECTED 2-4 TIMES DAILY TO TEST BLOOD SUGARS, Disp: , Rfl:    indapamide (LOZOL) 2.5 MG tablet, Take 2.5 mg by mouth daily., Disp: , Rfl:    insulin aspart (NOVOLOG) 100 UNIT/ML injection, Inject into the skin. TID sliding scale, Disp: , Rfl:    Insulin Syringe-Needle U-100 (INSULIN SYRINGE .3CC/31GX5/16") 31G X 5/16" 0.3 ML MISC, ,  Disp: , Rfl:    Iron-FA-B Cmp-C-Biot-Probiotic (FUSION PLUS) CAPS, Take 1 capsule by mouth daily after breakfast., Disp: 30 capsule, Rfl: 6   ONE TOUCH ULTRA TEST test strip, USE AS DIRECTED. TESTING FREQUENCY: 2-4 TIMES DAILY., Disp: , Rfl: 11   pioglitazone (ACTOS) 30 MG tablet, , Disp: , Rfl: 3   pramipexole (MIRAPEX) 1.5 MG tablet, Take 1.5 mg by mouth daily., Disp: , Rfl:    rosuvastatin (CRESTOR) 20 MG tablet, Take 20 mg by mouth daily., Disp: , Rfl: 3   TOUJEO SOLOSTAR 300 UNIT/ML SOPN, 70 Units daily., Disp: , Rfl: 12   traZODone (DESYREL) 50 MG tablet, Take by mouth at bedtime as needed., Disp: , Rfl:   Allergies:  Allergies  Allergen Reactions   Ace Inhibitors Other (See Comments)    UNKNOWN REACTION   Atorvastatin Other (See Comments)    UNKNOWN REACTION    Hydrochlorothiazide Rash        Lisinopril-Hydrochlorothiazide Rash    Past Medical History, Surgical history, Social history, and Family History were reviewed and updated.  Review of Systems: Review of Systems  Constitutional:  Positive for fatigue.  HENT:  Negative.    Eyes: Negative.   Respiratory: Negative.    Cardiovascular: Negative.   Gastrointestinal: Negative.   Endocrine: Negative.  Genitourinary: Negative.    Musculoskeletal: Negative.   Skin: Negative.   Neurological: Negative.   Hematological: Negative.   Psychiatric/Behavioral: Negative.      Physical Exam:  height is 6\' 1"  (1.854 m) and weight is 273 lb 6.4 oz (124 kg). His oral temperature is 98.5 F (36.9 C). His blood pressure is 105/72 and his pulse is 82. His respiration is 18 and oxygen saturation is 98%.   Wt Readings from Last 3 Encounters:  03/27/23 273 lb 6.4 oz (124 kg)  02/26/23 270 lb (122.5 kg)  01/26/23 265 lb (120.2 kg)    Physical Exam Vitals reviewed.  HENT:     Head: Normocephalic and atraumatic.  Eyes:     Pupils: Pupils are equal, round, and reactive to light.  Cardiovascular:     Rate and Rhythm: Normal  rate and regular rhythm.     Heart sounds: Normal heart sounds.  Pulmonary:     Effort: Pulmonary effort is normal.     Breath sounds: Normal breath sounds.  Abdominal:     General: Bowel sounds are normal.     Palpations: Abdomen is soft.     Comments: Abdominal exam is soft.  Bowel sounds are present.  There is no fluid wave.  There is no palpable liver or spleen tip.  Musculoskeletal:        General: No tenderness or deformity. Normal range of motion.     Cervical back: Normal range of motion.  Lymphadenopathy:     Cervical: No cervical adenopathy.  Skin:    General: Skin is warm and dry.     Findings: No erythema or rash.     Comments: On the back of his right tricep, he has a surgical scar that is vertical.  This is healed.  Neurological:     Mental Status: He is alert and oriented to person, place, and time.  Psychiatric:        Behavior: Behavior normal.        Thought Content: Thought content normal.        Judgment: Judgment normal.     Lab Results  Component Value Date   WBC 8.4 03/27/2023   HGB 16.9 03/27/2023   HCT 51.2 03/27/2023   MCV 98.1 03/27/2023   PLT 581 (H) 03/27/2023     Chemistry      Component Value Date/Time   NA 139 03/27/2023 1014   NA 143 08/11/2017 1332   NA 140 03/31/2017 1005   K 3.9 03/27/2023 1014   K 3.9 08/11/2017 1332   K 4.3 03/31/2017 1005   CL 100 03/27/2023 1014   CL 104 08/11/2017 1332   CO2 32 03/27/2023 1014   CO2 27 08/11/2017 1332   CO2 25 03/31/2017 1005   BUN 18 03/27/2023 1014   BUN 14 08/11/2017 1332   BUN 17.4 03/31/2017 1005   CREATININE 1.20 03/27/2023 1014   CREATININE 1.3 (H) 08/11/2017 1332   CREATININE 1.1 03/31/2017 1005      Component Value Date/Time   CALCIUM 9.7 03/27/2023 1014   CALCIUM 9.4 08/11/2017 1332   CALCIUM 9.5 03/31/2017 1005   ALKPHOS 59 03/27/2023 1014   ALKPHOS 92 (H) 08/11/2017 1332   ALKPHOS 100 03/31/2017 1005   AST 18 03/27/2023 1014   AST 17 03/31/2017 1005   ALT 17  03/27/2023 1014   ALT 25 08/11/2017 1332   ALT <6 03/31/2017 1005   BILITOT 0.6 03/27/2023 1014   BILITOT 0.54 03/31/2017 1005  On his peripheral blood smear, he has some mild anisocytosis and poikilocytosis.  There are no nucleated red blood cells.  I see no teardrop cells.  He has no rouleaux formation.  White blood cells appear mature.  He has no hypersegmented polys.  I see no blast.  He has no immature lymphoid cells.  Platelets are increased in number.  He has several large platelets.  Platelets are well granulated.  Impression and Plan: Mr. Berninger is a very nice 52 year old white male.  We had seen him probably about 7 or 8 years ago.  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  He ultimately underwent an allogenic transplant.  At this point, we will just watch him.  His platelet count is trending upward.  Everything else looks fine with respect to his CBC.  We will probably repeat another bone marrow test sometime in November or December.,  Depending on his blood counts.  I would like to see him back in a month.  I think to follow him when he comes in with his lab work just to make sure that he is okay physically.   Josph Macho, MD 6/28/202410:57 AM

## 2023-04-24 ENCOUNTER — Other Ambulatory Visit: Payer: Self-pay

## 2023-04-24 ENCOUNTER — Inpatient Hospital Stay: Payer: Medicare Other | Attending: Hematology & Oncology

## 2023-04-24 ENCOUNTER — Inpatient Hospital Stay: Payer: Medicare Other | Admitting: Hematology & Oncology

## 2023-04-24 ENCOUNTER — Encounter: Payer: Self-pay | Admitting: Hematology & Oncology

## 2023-04-24 VITALS — BP 112/85 | HR 90 | Temp 98.0°F | Resp 18 | Ht 73.0 in | Wt 271.8 lb

## 2023-04-24 DIAGNOSIS — Z9484 Stem cells transplant status: Secondary | ICD-10-CM | POA: Diagnosis not present

## 2023-04-24 DIAGNOSIS — D75839 Thrombocytosis, unspecified: Secondary | ICD-10-CM | POA: Insufficient documentation

## 2023-04-24 DIAGNOSIS — D469 Myelodysplastic syndrome, unspecified: Secondary | ICD-10-CM | POA: Diagnosis not present

## 2023-04-24 LAB — CMP (CANCER CENTER ONLY)
ALT: 13 U/L (ref 0–44)
AST: 15 U/L (ref 15–41)
Albumin: 4.4 g/dL (ref 3.5–5.0)
Alkaline Phosphatase: 55 U/L (ref 38–126)
Anion gap: 6 (ref 5–15)
BUN: 13 mg/dL (ref 6–20)
CO2: 31 mmol/L (ref 22–32)
Calcium: 9.5 mg/dL (ref 8.9–10.3)
Chloride: 98 mmol/L (ref 98–111)
Creatinine: 1.09 mg/dL (ref 0.61–1.24)
GFR, Estimated: >60 mL/min (ref >60)
Glucose, Bld: 338 mg/dL — ABNORMAL HIGH (ref 70–99)
Potassium: 4.1 mmol/L (ref 3.5–5.1)
Sodium: 135 mmol/L (ref 135–145)
Total Bilirubin: 0.6 mg/dL (ref 0.3–1.2)
Total Protein: 6.7 g/dL (ref 6.5–8.1)

## 2023-04-24 LAB — CBC WITH DIFFERENTIAL (CANCER CENTER ONLY)
Abs Immature Granulocytes: 0.11 10*3/uL — ABNORMAL HIGH (ref 0.00–0.07)
Basophils Absolute: 0 10*3/uL (ref 0.0–0.1)
Basophils Relative: 1 %
Eosinophils Absolute: 0.2 10*3/uL (ref 0.0–0.5)
Eosinophils Relative: 2 %
HCT: 50.7 % (ref 39.0–52.0)
Hemoglobin: 16.5 g/dL (ref 13.0–17.0)
Immature Granulocytes: 2 %
Lymphocytes Relative: 26 %
Lymphs Abs: 1.7 10*3/uL (ref 0.7–4.0)
MCH: 32.3 pg (ref 26.0–34.0)
MCHC: 32.5 g/dL (ref 30.0–36.0)
MCV: 99.2 fL (ref 80.0–100.0)
Monocytes Absolute: 1.4 10*3/uL — ABNORMAL HIGH (ref 0.1–1.0)
Monocytes Relative: 22 %
Neutro Abs: 3.1 10*3/uL (ref 1.7–7.7)
Neutrophils Relative %: 47 %
Platelet Count: 589 10*3/uL — ABNORMAL HIGH (ref 150–400)
RBC: 5.11 MIL/uL (ref 4.22–5.81)
RDW: 16.2 % — ABNORMAL HIGH (ref 11.5–15.5)
WBC Count: 6.5 10*3/uL (ref 4.0–10.5)
nRBC: 0 % (ref 0.0–0.2)

## 2023-04-24 LAB — IRON AND IRON BINDING CAPACITY (CC-WL,HP ONLY)
Iron: 116 ug/dL (ref 45–182)
Saturation Ratios: 36 % (ref 17.9–39.5)
TIBC: 321 ug/dL (ref 250–450)
UIBC: 205 ug/dL (ref 117–376)

## 2023-04-24 LAB — FERRITIN: Ferritin: 24 ng/mL (ref 24–336)

## 2023-04-24 LAB — LACTATE DEHYDROGENASE: LDH: 163 U/L (ref 98–192)

## 2023-04-24 LAB — SAVE SMEAR(SSMR), FOR PROVIDER SLIDE REVIEW

## 2023-04-24 NOTE — Progress Notes (Signed)
Hematology and Oncology Follow Up Visit  Patrick HABERKORN [id number] [birth year] 52 y.o. 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 Patrick Blake is is back for follow-up.  He had seized quite well.  He and his wife are going go to the mountains this weekend and go tubing.  I do not make sure they were sunscreen and drink water.  His blood sugar still high but on the high side.  Apparently, with his thrombocytosis, his CGM does not record accurately.  Hopefully, this can be remedied.  He has had no problems with cough or shortness of breath.  He has had no change in bowel or bladder habits.  He has had no rashes.  He does have a lesion on the upper aspect of his left lower leg.  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  He is going to have this removed next week.  Of note, his iron studies that were done back in June showed a ferritin of 30 with an iron saturation of 34%.  He has had no obvious bleeding.  There is been no bruising.  Currently, I would say that his performance status is probably ECOG 1.    Medications:  Current Outpatient Medications:    busPIRone (BUSPAR) 10 MG tablet, Take 20 mg by mouth 2 (two) times daily., Disp: , Rfl:    DULoxetine (CYMBALTA) 30 MG capsule, Take 30 mg by mouth 2 (two) times daily., Disp: , Rfl:    ezetimibe (ZETIA) 10 MG tablet, Take 10 mg by mouth daily., Disp: , Rfl: 3   glucose blood test strip, USE AS DIRECTED 2-4 TIMES DAILY TO TEST BLOOD SUGARS, Disp: , Rfl:    indapamide (LOZOL) 2.5 MG tablet, Take 2.5 mg by mouth daily., Disp: , Rfl:    insulin aspart (NOVOLOG) 100 UNIT/ML injection, Inject into the skin. TID sliding scale, Disp: , Rfl:    Insulin Syringe-Needle U-100 (INSULIN SYRINGE .3CC/31GX5/16") 31G X 5/16" 0.3 ML MISC, , Disp: , Rfl:    ONE TOUCH ULTRA TEST test strip,  USE AS DIRECTED. TESTING FREQUENCY: 2-4 TIMES DAILY., Disp: , Rfl: 11   pioglitazone (ACTOS) 30 MG tablet, , Disp: , Rfl: 3   pramipexole (MIRAPEX) 1.5 MG tablet, Take 1.5 mg by mouth daily., Disp: , Rfl:    rosuvastatin (CRESTOR) 20 MG tablet, Take 20 mg by mouth daily., Disp: , Rfl: 3   TOUJEO SOLOSTAR 300 UNIT/ML SOPN, 70 Units daily., Disp: , Rfl: 12   traZODone (DESYREL) 50 MG tablet, Take by mouth at bedtime as needed., Disp: , Rfl:   Allergies:  Allergies  Allergen Reactions   Ace Inhibitors Other (See Comments)    UNKNOWN REACTION   Atorvastatin Other (See Comments)    UNKNOWN REACTION    Hydrochlorothiazide Rash        Lisinopril-Hydrochlorothiazide Rash    Past Medical History, Surgical history, Social history, and Family History were reviewed and updated.  Review of Systems: Review of Systems  Constitutional:  Positive for fatigue.  HENT:  Negative.    Eyes: Negative.   Respiratory: Negative.    Cardiovascular: Negative.   Gastrointestinal: Negative.   Endocrine: Negative.   Genitourinary: Negative.    Musculoskeletal: Negative.   Skin: Negative.   Neurological: Negative.   Hematological: Negative.   Psychiatric/Behavioral: Negative.  Physical Exam:  height is 6\' 1"  (1.854 m) and weight is 271 lb 12.8 oz (123.3 kg). His oral temperature is 98 F (36.7 C). His blood pressure is 112/85 and his pulse is 90. His respiration is 18 and oxygen saturation is 97%.   Wt Readings from Last 3 Encounters:  04/24/23 271 lb 12.8 oz (123.3 kg)  03/27/23 273 lb 6.4 oz (124 kg)  02/26/23 270 lb (122.5 kg)    Physical Exam Vitals reviewed.  HENT:     Head: Normocephalic and atraumatic.  Eyes:     Pupils: Pupils are equal, round, and reactive to light.  Cardiovascular:     Rate and Rhythm: Normal rate and regular rhythm.     Heart sounds: Normal heart sounds.  Pulmonary:     Effort: Pulmonary effort is normal.     Breath sounds: Normal breath sounds.   Abdominal:     General: Bowel sounds are normal.     Palpations: Abdomen is soft.     Comments: Abdominal exam is soft.  Bowel sounds are present.  There is no fluid wave.  There is no palpable liver or spleen tip.  Musculoskeletal:        General: No tenderness or deformity. Normal range of motion.     Cervical back: Normal range of motion.  Lymphadenopathy:     Cervical: No cervical adenopathy.  Skin:    General: Skin is warm and dry.     Findings: No erythema or rash.     Comments: On the back of his right tricep, he has a surgical scar that is vertical.  This is healed.  Neurological:     Mental Status: He is alert and oriented to person, place, and time.  Psychiatric:        Behavior: Behavior normal.        Thought Content: Thought content normal.        Judgment: Judgment normal.      Lab Results  Component Value Date   WBC 6.5 04/24/2023   HGB 16.5 04/24/2023   HCT 50.7 04/24/2023   MCV 99.2 04/24/2023   PLT 589 (H) 04/24/2023     Chemistry      Component Value Date/Time   NA 135 04/24/2023 1034   NA 143 08/11/2017 1332   NA 140 03/31/2017 1005   K 4.1 04/24/2023 1034   K 3.9 08/11/2017 1332   K 4.3 03/31/2017 1005   CL 98 04/24/2023 1034   CL 104 08/11/2017 1332   CO2 31 04/24/2023 1034   CO2 27 08/11/2017 1332   CO2 25 03/31/2017 1005   BUN 13 04/24/2023 1034   BUN 14 08/11/2017 1332   BUN 17.4 03/31/2017 1005   CREATININE 1.09 04/24/2023 1034   CREATININE 1.3 (H) 08/11/2017 1332   CREATININE 1.1 03/31/2017 1005      Component Value Date/Time   CALCIUM 9.5 04/24/2023 1034   CALCIUM 9.4 08/11/2017 1332   CALCIUM 9.5 03/31/2017 1005   ALKPHOS 55 04/24/2023 1034   ALKPHOS 92 (H) 08/11/2017 1332   ALKPHOS 100 03/31/2017 1005   AST 15 04/24/2023 1034   AST 17 03/31/2017 1005   ALT 13 04/24/2023 1034   ALT 25 08/11/2017 1332   ALT <6 03/31/2017 1005   BILITOT 0.6 04/24/2023 1034   BILITOT 0.54 03/31/2017 1005     On his peripheral blood smear,  he has some mild anisocytosis and poikilocytosis.  There are no nucleated red blood cells.  I  see no teardrop cells.  He has no rouleaux formation.  White blood cells appear mature.  He has no hypersegmented polys.  I see no blast.  He has no immature lymphoid cells.  Platelets are increased in number.  He has several large platelets.  Platelets are well granulated.  Impression and Plan: Mr. Vukovich is a very nice 52 year old white male.  We had seen him probably about 7 or 8 years ago.  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  He ultimately underwent an allogenic transplant.  His platelet count is trending up very slowly.  He is asymptomatic.  As such, I think we can still watch treatment.  I am sure Dr. Teola Bradley I do also would agree with close observation.  I would like to get him back in another month or so.   Josph Macho, MD 7/26/202411:33 AM

## 2023-05-25 ENCOUNTER — Telehealth: Payer: Self-pay | Admitting: Hematology & Oncology

## 2023-05-25 ENCOUNTER — Encounter: Payer: Self-pay | Admitting: Hematology & Oncology

## 2023-05-25 ENCOUNTER — Inpatient Hospital Stay (HOSPITAL_BASED_OUTPATIENT_CLINIC_OR_DEPARTMENT_OTHER): Payer: Medicare Other | Admitting: Hematology & Oncology

## 2023-05-25 ENCOUNTER — Inpatient Hospital Stay: Payer: Medicare Other | Attending: Hematology & Oncology

## 2023-05-25 VITALS — BP 120/78 | HR 87 | Temp 98.7°F | Resp 20 | Ht 73.0 in | Wt 278.8 lb

## 2023-05-25 DIAGNOSIS — Z9484 Stem cells transplant status: Secondary | ICD-10-CM | POA: Diagnosis not present

## 2023-05-25 DIAGNOSIS — D469 Myelodysplastic syndrome, unspecified: Secondary | ICD-10-CM

## 2023-05-25 DIAGNOSIS — Z7984 Long term (current) use of oral hypoglycemic drugs: Secondary | ICD-10-CM | POA: Insufficient documentation

## 2023-05-25 DIAGNOSIS — Z79899 Other long term (current) drug therapy: Secondary | ICD-10-CM | POA: Insufficient documentation

## 2023-05-25 LAB — CBC WITH DIFFERENTIAL (CANCER CENTER ONLY)
Abs Immature Granulocytes: 0.07 10*3/uL (ref 0.00–0.07)
Basophils Absolute: 0 10*3/uL (ref 0.0–0.1)
Basophils Relative: 0 %
Eosinophils Absolute: 0.2 10*3/uL (ref 0.0–0.5)
Eosinophils Relative: 3 %
HCT: 53.5 % — ABNORMAL HIGH (ref 39.0–52.0)
Hemoglobin: 17.3 g/dL — ABNORMAL HIGH (ref 13.0–17.0)
Immature Granulocytes: 1 %
Lymphocytes Relative: 31 %
Lymphs Abs: 1.8 10*3/uL (ref 0.7–4.0)
MCH: 32.3 pg (ref 26.0–34.0)
MCHC: 32.3 g/dL (ref 30.0–36.0)
MCV: 99.8 fL (ref 80.0–100.0)
Monocytes Absolute: 1.4 10*3/uL — ABNORMAL HIGH (ref 0.1–1.0)
Monocytes Relative: 24 %
Neutro Abs: 2.4 10*3/uL (ref 1.7–7.7)
Neutrophils Relative %: 41 %
Platelet Count: 560 10*3/uL — ABNORMAL HIGH (ref 150–400)
RBC: 5.36 MIL/uL (ref 4.22–5.81)
RDW: 15.3 % (ref 11.5–15.5)
WBC Count: 5.7 10*3/uL (ref 4.0–10.5)
nRBC: 0 % (ref 0.0–0.2)

## 2023-05-25 LAB — IRON AND IRON BINDING CAPACITY (CC-WL,HP ONLY)
Iron: 168 ug/dL (ref 45–182)
Saturation Ratios: 50 % — ABNORMAL HIGH (ref 17.9–39.5)
TIBC: 337 ug/dL (ref 250–450)
UIBC: 169 ug/dL (ref 117–376)

## 2023-05-25 LAB — CMP (CANCER CENTER ONLY)
ALT: 13 U/L (ref 0–44)
AST: 12 U/L — ABNORMAL LOW (ref 15–41)
Albumin: 4.5 g/dL (ref 3.5–5.0)
Alkaline Phosphatase: 56 U/L (ref 38–126)
Anion gap: 9 (ref 5–15)
BUN: 13 mg/dL (ref 6–20)
CO2: 30 mmol/L (ref 22–32)
Calcium: 10 mg/dL (ref 8.9–10.3)
Chloride: 99 mmol/L (ref 98–111)
Creatinine: 1.02 mg/dL (ref 0.61–1.24)
GFR, Estimated: >60 mL/min (ref >60)
Glucose, Bld: 156 mg/dL — ABNORMAL HIGH (ref 70–99)
Potassium: 4 mmol/L (ref 3.5–5.1)
Sodium: 138 mmol/L (ref 135–145)
Total Bilirubin: 0.6 mg/dL (ref 0.3–1.2)
Total Protein: 7 g/dL (ref 6.5–8.1)

## 2023-05-25 LAB — FERRITIN: Ferritin: 32 ng/mL (ref 24–336)

## 2023-05-25 LAB — LACTATE DEHYDROGENASE: LDH: 163 U/L (ref 98–192)

## 2023-05-25 LAB — SAVE SMEAR(SSMR), FOR PROVIDER SLIDE REVIEW
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
## 2023-05-25 NOTE — Telephone Encounter (Signed)
Patient has been scheduled for follow-up visit per 05/25/23 LOS.  Pt given appt information, spouse jotted info on her phone.

## 2023-07-10 ENCOUNTER — Inpatient Hospital Stay: Payer: Medicare Other | Attending: Hematology & Oncology

## 2023-07-10 ENCOUNTER — Inpatient Hospital Stay: Payer: Medicare Other | Admitting: Hematology & Oncology

## 2023-07-10 ENCOUNTER — Encounter: Payer: Self-pay | Admitting: Hematology & Oncology

## 2023-07-10 VITALS — BP 118/81 | HR 101 | Temp 98.2°F | Resp 20 | Ht 73.0 in | Wt 283.0 lb

## 2023-07-10 DIAGNOSIS — D751 Secondary polycythemia: Secondary | ICD-10-CM | POA: Diagnosis not present

## 2023-07-10 DIAGNOSIS — D469 Myelodysplastic syndrome, unspecified: Secondary | ICD-10-CM | POA: Diagnosis present

## 2023-07-10 DIAGNOSIS — C946 Myelodysplastic disease, not classified: Secondary | ICD-10-CM | POA: Diagnosis not present

## 2023-07-10 DIAGNOSIS — Z9484 Stem cells transplant status: Secondary | ICD-10-CM | POA: Insufficient documentation

## 2023-07-10 DIAGNOSIS — D75839 Thrombocytosis, unspecified: Secondary | ICD-10-CM | POA: Diagnosis not present

## 2023-07-10 LAB — CBC WITH DIFFERENTIAL (CANCER CENTER ONLY)
Abs Immature Granulocytes: 0.04 10*3/uL (ref 0.00–0.07)
Basophils Absolute: 0 10*3/uL (ref 0.0–0.1)
Basophils Relative: 0 %
Eosinophils Absolute: 0.1 10*3/uL (ref 0.0–0.5)
Eosinophils Relative: 3 %
HCT: 53.6 % — ABNORMAL HIGH (ref 39.0–52.0)
Hemoglobin: 17.5 g/dL — ABNORMAL HIGH (ref 13.0–17.0)
Immature Granulocytes: 1 %
Lymphocytes Relative: 30 %
Lymphs Abs: 1.4 10*3/uL (ref 0.7–4.0)
MCH: 32.3 pg (ref 26.0–34.0)
MCHC: 32.6 g/dL (ref 30.0–36.0)
MCV: 99.1 fL (ref 80.0–100.0)
Monocytes Absolute: 1 10*3/uL (ref 0.1–1.0)
Monocytes Relative: 22 %
Neutro Abs: 2.1 10*3/uL (ref 1.7–7.7)
Neutrophils Relative %: 44 %
Platelet Count: 518 10*3/uL — ABNORMAL HIGH (ref 150–400)
RBC: 5.41 MIL/uL (ref 4.22–5.81)
RDW: 14.7 % (ref 11.5–15.5)
WBC Count: 4.6 10*3/uL (ref 4.0–10.5)
nRBC: 0 % (ref 0.0–0.2)

## 2023-07-10 LAB — CMP (CANCER CENTER ONLY)
ALT: 14 U/L (ref 0–44)
AST: 6 U/L — ABNORMAL LOW (ref 15–41)
Albumin: 4.3 g/dL (ref 3.5–5.0)
Alkaline Phosphatase: 53 U/L (ref 38–126)
Anion gap: 9 (ref 5–15)
BUN: 17 mg/dL (ref 6–20)
CO2: 32 mmol/L (ref 22–32)
Calcium: 9.3 mg/dL (ref 8.9–10.3)
Chloride: 98 mmol/L (ref 98–111)
Creatinine: 1.22 mg/dL (ref 0.61–1.24)
GFR, Estimated: >60 mL/min (ref >60)
Glucose, Bld: 251 mg/dL — ABNORMAL HIGH (ref 70–99)
Potassium: 3.6 mmol/L (ref 3.5–5.1)
Sodium: 139 mmol/L (ref 135–145)
Total Bilirubin: 0.6 mg/dL (ref 0.3–1.2)
Total Protein: 7.4 g/dL (ref 6.5–8.1)

## 2023-07-10 LAB — LACTATE DEHYDROGENASE: LDH: 170 U/L (ref 98–192)

## 2023-07-10 LAB — FERRITIN: Ferritin: 52 ng/mL (ref 24–336)

## 2023-07-10 LAB — SAVE SMEAR(SSMR), FOR PROVIDER SLIDE REVIEW

## 2023-07-10 NOTE — Progress Notes (Signed)
Hematology and Oncology Follow Up Visit  RUEL TOSI [id number] October 27, 1970 52 y.o. 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 Comunale is is back for follow-up.  He seems to be doing quite nicely.  He has had no issues with bleeding or bruising.  He has had no headache.  He is having no cough or shortness of breath.  Has been no change in bowel or bladder habits.  He has had no diarrhea.  Has been no bleeding or bruising.  He has had no mouth sores.  He has had no leg swelling.  When we last saw him, his ferritin was 32 with an iron saturation of 50%.  Currently, I would have to say that his performance status is probably ECOG 1.    Medications:  Current Outpatient Medications:    busPIRone (BUSPAR) 10 MG tablet, Take 20 mg by mouth 2 (two) times daily., Disp: , Rfl:    DULoxetine (CYMBALTA) 30 MG capsule, Take 30 mg by mouth 2 (two) times daily., Disp: , Rfl:    ezetimibe (ZETIA) 10 MG tablet, Take 10 mg by mouth daily., Disp: , Rfl: 3   glucose blood test strip, USE AS DIRECTED 2-4 TIMES DAILY TO TEST BLOOD SUGARS, Disp: , Rfl:    indapamide (LOZOL) 2.5 MG tablet, Take 2.5 mg by mouth daily., Disp: , Rfl:    insulin aspart (NOVOLOG) 100 UNIT/ML injection, Inject into the skin. TID sliding scale, Disp: , Rfl:    Insulin Syringe-Needle U-100 (INSULIN SYRINGE .3CC/31GX5/16") 31G X 5/16" 0.3 ML MISC, , Disp: , Rfl:    ONE TOUCH ULTRA TEST test strip, USE AS DIRECTED. TESTING FREQUENCY: 2-4 TIMES DAILY., Disp: , Rfl: 11   pioglitazone (ACTOS) 30 MG tablet, Take 30 mg by mouth daily., Disp: , Rfl: 3   pramipexole (MIRAPEX) 1.5 MG tablet, Take 1.5 mg by mouth daily., Disp: , Rfl:    rosuvastatin (CRESTOR) 20 MG tablet, Take 20 mg by mouth daily., Disp: , Rfl: 3   tadalafil (CIALIS)  10 MG tablet, Take 0.5-1 tablets by mouth daily as needed., Disp: , Rfl:    TOUJEO SOLOSTAR 300 UNIT/ML SOPN, 70 Units daily., Disp: , Rfl: 12   traZODone (DESYREL) 50 MG tablet, Take by mouth at bedtime as needed., Disp: , Rfl:    prazosin (MINIPRESS) 2 MG capsule, Take 2 mg by mouth 2 (two) times daily., Disp: , Rfl:   Allergies:  Allergies  Allergen Reactions   Ace Inhibitors Other (See Comments)    UNKNOWN REACTION   Atorvastatin Other (See Comments)    UNKNOWN REACTION    Hydrochlorothiazide Rash        Lisinopril-Hydrochlorothiazide Rash    Past Medical History, Surgical history, Social history, and Family History were reviewed and updated.  Review of Systems: Review of Systems  Constitutional:  Positive for fatigue.  HENT:  Negative.    Eyes: Negative.   Respiratory: Negative.    Cardiovascular: Negative.   Gastrointestinal: Negative.   Endocrine: Negative.   Genitourinary: Negative.    Musculoskeletal: Negative.   Skin: Negative.   Neurological: Negative.   Hematological: Negative.   Psychiatric/Behavioral: Negative.      Physical Exam:  height is 6\' 1"  (1.854 m) and weight is 283 lb 0.4  oz (128.4 kg). His oral temperature is 98.2 F (36.8 C). His blood pressure is 118/81 and his pulse is 101 (abnormal). His respiration is 20 and oxygen saturation is 96%.   Wt Readings from Last 3 Encounters:  07/10/23 283 lb 0.4 oz (128.4 kg)  05/25/23 278 lb 12.8 oz (126.5 kg)  04/24/23 271 lb 12.8 oz (123.3 kg)    Physical Exam Vitals reviewed.  HENT:     Head: Normocephalic and atraumatic.  Eyes:     Pupils: Pupils are equal, round, and reactive to light.  Cardiovascular:     Rate and Rhythm: Normal rate and regular rhythm.     Heart sounds: Normal heart sounds.  Pulmonary:     Effort: Pulmonary effort is normal.     Breath sounds: Normal breath sounds.  Abdominal:     General: Bowel sounds are normal.     Palpations: Abdomen is soft.     Comments: Abdominal  exam is soft.  Bowel sounds are present.  There is no fluid wave.  There is no palpable liver or spleen tip.  Musculoskeletal:        General: No tenderness or deformity. Normal range of motion.     Cervical back: Normal range of motion.  Lymphadenopathy:     Cervical: No cervical adenopathy.  Skin:    General: Skin is warm and dry.     Findings: No erythema or rash.     Comments: On the back of his right tricep, he has a surgical scar that is vertical.  This is healed.  Neurological:     Mental Status: He is alert and oriented to person, place, and time.  Psychiatric:        Behavior: Behavior normal.        Thought Content: Thought content normal.        Judgment: Judgment normal.      Lab Results  Component Value Date   WBC 4.6 07/10/2023   HGB 17.5 (H) 07/10/2023   HCT 53.6 (H) 07/10/2023   MCV 99.1 07/10/2023   PLT 518 (H) 07/10/2023     Chemistry      Component Value Date/Time   NA 139 07/10/2023 1017   NA 143 08/11/2017 1332   NA 140 03/31/2017 1005   K 3.6 07/10/2023 1017   K 3.9 08/11/2017 1332   K 4.3 03/31/2017 1005   CL 98 07/10/2023 1017   CL 104 08/11/2017 1332   CO2 32 07/10/2023 1017   CO2 27 08/11/2017 1332   CO2 25 03/31/2017 1005   BUN 17 07/10/2023 1017   BUN 14 08/11/2017 1332   BUN 17.4 03/31/2017 1005   CREATININE 1.22 07/10/2023 1017   CREATININE 1.3 (H) 08/11/2017 1332   CREATININE 1.1 03/31/2017 1005      Component Value Date/Time   CALCIUM 9.3 07/10/2023 1017   CALCIUM 9.4 08/11/2017 1332   CALCIUM 9.5 03/31/2017 1005   ALKPHOS 53 07/10/2023 1017   ALKPHOS 92 (H) 08/11/2017 1332   ALKPHOS 100 03/31/2017 1005   AST 6 (L) 07/10/2023 1017   AST 17 03/31/2017 1005   ALT 14 07/10/2023 1017   ALT 25 08/11/2017 1332   ALT <6 03/31/2017 1005   BILITOT 0.6 07/10/2023 1017   BILITOT 0.54 03/31/2017 1005     On his peripheral blood smear, he has some mild anisocytosis and poikilocytosis.  There are no nucleated red blood cells.  I see  no teardrop cells.  He has no rouleaux  formation.  White blood cells appear mature.  He has no hypersegmented polys.  I see no blast.  He has no immature lymphoid cells.  Platelets are increased in number.  He has several large platelets.  Platelets are well granulated.  Impression and Plan: Mr. Prichett is a very nice 52 year old white male.  We had seen him probably about 7 or 8 years ago.  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  He ultimately underwent an allogenic transplant.  I am happy to see that his platelet count continues to improve.  Hopefully, we will see continued improvement.  He is somewhat polycythemic.  I told him to start taking a baby aspirin.  I told to make sure he takes it with food in the morning.  We will try to move his appointments out maybe a little bit longer.  We will try to get him back after Thanksgiving.  Josph Macho, MD 10/11/202411:22 AM

## 2023-07-13 LAB — IRON AND IRON BINDING CAPACITY (CC-WL,HP ONLY)
Iron: 196 ug/dL — ABNORMAL HIGH (ref 45–182)
Saturation Ratios: 55 % — ABNORMAL HIGH (ref 17.9–39.5)
TIBC: 356 ug/dL (ref 250–450)
UIBC: 160 ug/dL (ref 117–376)

## 2023-09-04 ENCOUNTER — Other Ambulatory Visit: Payer: Self-pay

## 2023-09-04 ENCOUNTER — Inpatient Hospital Stay (HOSPITAL_BASED_OUTPATIENT_CLINIC_OR_DEPARTMENT_OTHER): Payer: Medicare Other | Admitting: Hematology & Oncology

## 2023-09-04 ENCOUNTER — Inpatient Hospital Stay: Payer: Medicare Other | Attending: Hematology & Oncology

## 2023-09-04 ENCOUNTER — Encounter: Payer: Self-pay | Admitting: Hematology & Oncology

## 2023-09-04 VITALS — BP 115/76 | HR 85 | Temp 98.4°F | Resp 17 | Ht 73.0 in | Wt 283.0 lb

## 2023-09-04 DIAGNOSIS — Z9484 Stem cells transplant status: Secondary | ICD-10-CM | POA: Diagnosis not present

## 2023-09-04 DIAGNOSIS — Z7984 Long term (current) use of oral hypoglycemic drugs: Secondary | ICD-10-CM | POA: Insufficient documentation

## 2023-09-04 DIAGNOSIS — C946 Myelodysplastic disease, not classified: Secondary | ICD-10-CM

## 2023-09-04 DIAGNOSIS — D469 Myelodysplastic syndrome, unspecified: Secondary | ICD-10-CM | POA: Insufficient documentation

## 2023-09-04 DIAGNOSIS — Z79899 Other long term (current) drug therapy: Secondary | ICD-10-CM | POA: Diagnosis not present

## 2023-09-04 LAB — CBC WITH DIFFERENTIAL (CANCER CENTER ONLY)
Abs Immature Granulocytes: 0.05 10*3/uL (ref 0.00–0.07)
Basophils Absolute: 0 10*3/uL (ref 0.0–0.1)
Basophils Relative: 0 %
Eosinophils Absolute: 0.1 10*3/uL (ref 0.0–0.5)
Eosinophils Relative: 2 %
HCT: 49.9 % (ref 39.0–52.0)
Hemoglobin: 16.5 g/dL (ref 13.0–17.0)
Immature Granulocytes: 1 %
Lymphocytes Relative: 26 %
Lymphs Abs: 1.7 10*3/uL (ref 0.7–4.0)
MCH: 32.3 pg (ref 26.0–34.0)
MCHC: 33.1 g/dL (ref 30.0–36.0)
MCV: 97.7 fL (ref 80.0–100.0)
Monocytes Absolute: 1.6 10*3/uL — ABNORMAL HIGH (ref 0.1–1.0)
Monocytes Relative: 25 %
Neutro Abs: 3 10*3/uL (ref 1.7–7.7)
Neutrophils Relative %: 46 %
Platelet Count: 533 10*3/uL — ABNORMAL HIGH (ref 150–400)
RBC: 5.11 MIL/uL (ref 4.22–5.81)
RDW: 15.2 % (ref 11.5–15.5)
WBC Count: 6.4 10*3/uL (ref 4.0–10.5)
nRBC: 0 % (ref 0.0–0.2)

## 2023-09-04 LAB — CMP (CANCER CENTER ONLY)
ALT: 12 U/L (ref 0–44)
AST: 13 U/L — ABNORMAL LOW (ref 15–41)
Albumin: 4.2 g/dL (ref 3.5–5.0)
Alkaline Phosphatase: 55 U/L (ref 38–126)
Anion gap: 7 (ref 5–15)
BUN: 18 mg/dL (ref 6–20)
CO2: 31 mmol/L (ref 22–32)
Calcium: 9.6 mg/dL (ref 8.9–10.3)
Chloride: 100 mmol/L (ref 98–111)
Creatinine: 1.05 mg/dL (ref 0.61–1.24)
GFR, Estimated: >60 mL/min (ref >60)
Glucose, Bld: 227 mg/dL — ABNORMAL HIGH (ref 70–99)
Potassium: 4 mmol/L (ref 3.5–5.1)
Sodium: 138 mmol/L (ref 135–145)
Total Bilirubin: 0.6 mg/dL (ref <1.2)
Total Protein: 7 g/dL (ref 6.5–8.1)

## 2023-09-04 LAB — IRON AND IRON BINDING CAPACITY (CC-WL,HP ONLY)
Iron: 131 ug/dL (ref 45–182)
Saturation Ratios: 39 % (ref 17.9–39.5)
TIBC: 333 ug/dL (ref 250–450)
UIBC: 202 ug/dL (ref 117–376)

## 2023-09-04 LAB — FERRITIN: Ferritin: 37 ng/mL (ref 24–336)

## 2023-09-04 LAB — SAVE SMEAR(SSMR), FOR PROVIDER SLIDE REVIEW

## 2023-09-04 NOTE — Progress Notes (Signed)
Hematology and Oncology Follow Up Visit  Patrick Blake [id number] [birth year] 52 y.o. 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 is back for follow-up.  We last saw him back in October.  Since then, he has been doing pretty well.  He really has had no complaints.  His blood sugars are still on the higher side.  They tend to be quite high when he gets here.  He and his wife had a very nice Thanksgiving with their family.  They will have a very nice Christmas with their family.  He has had no problems with pain.  He has had no cough or shortness of breath.  He has had no nausea or vomiting.  He has had no change in bowel or bladder habits.  There has been no bleeding.  He has had no leg swelling.  There may be a little bit of neuropathy.  His last iron studies that were done back in October showed a ferritin 52 with an iron saturation of 55%.  He has had no fever.  He has had no problems with COVID.  Overall, I would say that his performance status is probably ECOG 1.  Medications:  Current Outpatient Medications:    busPIRone (BUSPAR) 10 MG tablet, Take 20 mg by mouth 2 (two) times daily., Disp: , Rfl:    DULoxetine (CYMBALTA) 30 MG capsule, Take 30 mg by mouth 2 (two) times daily., Disp: , Rfl:    ezetimibe (ZETIA) 10 MG tablet, Take 10 mg by mouth daily., Disp: , Rfl: 3   glucose blood test strip, USE AS DIRECTED 2-4 TIMES DAILY TO TEST BLOOD SUGARS, Disp: , Rfl:    indapamide (LOZOL) 2.5 MG tablet, Take 2.5 mg by mouth daily., Disp: , Rfl:    insulin aspart (NOVOLOG) 100 UNIT/ML injection, Inject into the skin. TID sliding scale, Disp: , Rfl:    Insulin Syringe-Needle U-100 (INSULIN SYRINGE .3CC/31GX5/16") 31G X 5/16" 0.3 ML MISC, , Disp: , Rfl:    ONE TOUCH ULTRA TEST  test strip, USE AS DIRECTED. TESTING FREQUENCY: 2-4 TIMES DAILY., Disp: , Rfl: 11   pioglitazone (ACTOS) 30 MG tablet, Take 30 mg by mouth daily., Disp: , Rfl: 3   pramipexole (MIRAPEX) 1.5 MG tablet, Take 1.5 mg by mouth daily., Disp: , Rfl:    prazosin (MINIPRESS) 2 MG capsule, Take 2 mg by mouth 2 (two) times daily., Disp: , Rfl:    rosuvastatin (CRESTOR) 20 MG tablet, Take 20 mg by mouth daily., Disp: , Rfl: 3   tadalafil (CIALIS) 10 MG tablet, Take 0.5-1 tablets by mouth daily as needed., Disp: , Rfl:    TOUJEO SOLOSTAR 300 UNIT/ML SOPN, 70 Units daily., Disp: , Rfl: 12   traZODone (DESYREL) 50 MG tablet, Take by mouth at bedtime as needed., Disp: , Rfl:   Allergies:  Allergies  Allergen Reactions   Ace Inhibitors Other (See Comments)    UNKNOWN REACTION   Atorvastatin Other (See Comments)    UNKNOWN REACTION    Hydrochlorothiazide Rash        Lisinopril-Hydrochlorothiazide Rash    Past Medical History, Surgical history, Social history, and Family History were reviewed and updated.  Review of Systems: Review of Systems  Constitutional:  Positive for fatigue.  HENT:  Negative.    Eyes: Negative.   Respiratory: Negative.    Cardiovascular: Negative.   Gastrointestinal: Negative.   Endocrine: Negative.   Genitourinary: Negative.    Musculoskeletal: Negative.   Skin: Negative.   Neurological: Negative.   Hematological: Negative.   Psychiatric/Behavioral: Negative.      Physical Exam:  height is 6\' 1"  (1.854 m) and weight is 283 lb (128.4 kg). His oral temperature is 98.4 F (36.9 C). His blood pressure is 115/76 and his pulse is 85. His respiration is 17 and oxygen saturation is 97%.   Wt Readings from Last 3 Encounters:  09/04/23 283 lb (128.4 kg)  07/10/23 283 lb 0.4 oz (128.4 kg)  05/25/23 278 lb 12.8 oz (126.5 kg)    Physical Exam Vitals reviewed.  HENT:     Head: Normocephalic and atraumatic.  Eyes:     Pupils: Pupils are equal, round, and reactive to  light.  Cardiovascular:     Rate and Rhythm: Normal rate and regular rhythm.     Heart sounds: Normal heart sounds.  Pulmonary:     Effort: Pulmonary effort is normal.     Breath sounds: Normal breath sounds.  Abdominal:     General: Bowel sounds are normal.     Palpations: Abdomen is soft.     Comments: Abdominal exam is soft.  Bowel sounds are present.  There is no fluid wave.  There is no palpable liver or spleen tip.  Musculoskeletal:        General: No tenderness or deformity. Normal range of motion.     Cervical back: Normal range of motion.  Lymphadenopathy:     Cervical: No cervical adenopathy.  Skin:    General: Skin is warm and dry.     Findings: No erythema or rash.     Comments: On the back of his right tricep, he has a surgical scar that is vertical.  This is healed.  Neurological:     Mental Status: He is alert and oriented to person, place, and time.  Psychiatric:        Behavior: Behavior normal.        Thought Content: Thought content normal.        Judgment: Judgment normal.      Lab Results  Component Value Date   WBC 6.4 09/04/2023   HGB 16.5 09/04/2023   HCT 49.9 09/04/2023   MCV 97.7 09/04/2023   PLT 533 (H) 09/04/2023     Chemistry      Component Value Date/Time   NA 138 09/04/2023 1028   NA 143 08/11/2017 1332   NA 140 03/31/2017 1005   K 4.0 09/04/2023 1028   K 3.9 08/11/2017 1332   K 4.3 03/31/2017 1005   CL 100 09/04/2023 1028   CL 104 08/11/2017 1332   CO2 31 09/04/2023 1028   CO2 27 08/11/2017 1332   CO2 25 03/31/2017 1005   BUN 18 09/04/2023 1028   BUN 14 08/11/2017 1332   BUN 17.4 03/31/2017 1005   CREATININE 1.05 09/04/2023 1028   CREATININE 1.3 (H) 08/11/2017 1332   CREATININE 1.1 03/31/2017 1005      Component Value Date/Time   CALCIUM 9.6 09/04/2023 1028   CALCIUM 9.4 08/11/2017 1332   CALCIUM 9.5 03/31/2017 1005   ALKPHOS 55 09/04/2023 1028   ALKPHOS 92 (H) 08/11/2017 1332   ALKPHOS 100 03/31/2017 1005   AST 13 (L)  09/04/2023 1028   AST 17 03/31/2017 1005  ALT 12 09/04/2023 1028   ALT 25 08/11/2017 1332   ALT <6 03/31/2017 1005   BILITOT 0.6 09/04/2023 1028   BILITOT 0.54 03/31/2017 1005     On his peripheral blood smear, he has some mild anisocytosis and poikilocytosis.  There are no nucleated red blood cells.  I see no teardrop cells.  He has no rouleaux formation.  White blood cells appear mature.  He has no hypersegmented polys.  I see no blast.  He has no immature lymphoid cells.  Platelets are increased in number.  He has several large platelets.  Platelets are well granulated.  Impression and Plan: Mr. Bogie is a very nice 52 year old white male.  We had seen him probably about 7 or 8 years ago.  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  He ultimately underwent an allogenic transplant.  So far, everything is looking pretty stable.  I am very happy for him.  He is totally asymptomatic.  He has a good quality of life.  I know that [facility] is following him.  They will certainly consider him for another transplant.  However, for right now, they just wish to watch him.  We will plan to get back in late February or early March.  Will try to get him through most of Winter.  Josph Macho, MD 12/6/202412:05 PM

## 2023-09-11 LAB — MISC LABCORP TEST (SEND OUT): Labcorp test code: 511060

## 2023-11-27 ENCOUNTER — Inpatient Hospital Stay (HOSPITAL_BASED_OUTPATIENT_CLINIC_OR_DEPARTMENT_OTHER): Payer: Medicare Other | Admitting: Hematology & Oncology

## 2023-11-27 ENCOUNTER — Encounter: Payer: Self-pay | Admitting: Hematology & Oncology

## 2023-11-27 ENCOUNTER — Inpatient Hospital Stay: Payer: Medicare Other | Attending: Hematology & Oncology

## 2023-11-27 VITALS — BP 108/77 | HR 100 | Temp 99.2°F | Resp 18 | Wt 286.0 lb

## 2023-11-27 DIAGNOSIS — C946 Myelodysplastic disease, not classified: Secondary | ICD-10-CM

## 2023-11-27 DIAGNOSIS — D469 Myelodysplastic syndrome, unspecified: Secondary | ICD-10-CM | POA: Insufficient documentation

## 2023-11-27 DIAGNOSIS — Z79899 Other long term (current) drug therapy: Secondary | ICD-10-CM | POA: Insufficient documentation

## 2023-11-27 DIAGNOSIS — Z9484 Stem cells transplant status: Secondary | ICD-10-CM | POA: Insufficient documentation

## 2023-11-27 LAB — CMP (CANCER CENTER ONLY)
ALT: 13 U/L (ref 0–44)
AST: 14 U/L — ABNORMAL LOW (ref 15–41)
Albumin: 4.4 g/dL (ref 3.5–5.0)
Alkaline Phosphatase: 57 U/L (ref 38–126)
Anion gap: 5 (ref 5–15)
BUN: 14 mg/dL (ref 6–20)
CO2: 31 mmol/L (ref 22–32)
Calcium: 9.9 mg/dL (ref 8.9–10.3)
Chloride: 101 mmol/L (ref 98–111)
Creatinine: 1.23 mg/dL (ref 0.61–1.24)
GFR, Estimated: >60 mL/min (ref >60)
Glucose, Bld: 182 mg/dL — ABNORMAL HIGH (ref 70–99)
Potassium: 3.8 mmol/L (ref 3.5–5.1)
Sodium: 137 mmol/L (ref 135–145)
Total Bilirubin: 0.5 mg/dL (ref 0.0–1.2)
Total Protein: 7 g/dL (ref 6.5–8.1)

## 2023-11-27 LAB — CBC WITH DIFFERENTIAL (CANCER CENTER ONLY)
Abs Immature Granulocytes: 0.12 10*3/uL — ABNORMAL HIGH (ref 0.00–0.07)
Basophils Absolute: 0 10*3/uL (ref 0.0–0.1)
Basophils Relative: 0 %
Eosinophils Absolute: 0.1 10*3/uL (ref 0.0–0.5)
Eosinophils Relative: 2 %
HCT: 49.1 % (ref 39.0–52.0)
Hemoglobin: 16.3 g/dL (ref 13.0–17.0)
Immature Granulocytes: 2 %
Lymphocytes Relative: 25 %
Lymphs Abs: 1.9 10*3/uL (ref 0.7–4.0)
MCH: 31.5 pg (ref 26.0–34.0)
MCHC: 33.2 g/dL (ref 30.0–36.0)
MCV: 95 fL (ref 80.0–100.0)
Monocytes Absolute: 1.8 10*3/uL — ABNORMAL HIGH (ref 0.1–1.0)
Monocytes Relative: 25 %
Neutro Abs: 3.4 10*3/uL (ref 1.7–7.7)
Neutrophils Relative %: 46 %
Platelet Count: 759 10*3/uL — ABNORMAL HIGH (ref 150–400)
RBC: 5.17 MIL/uL (ref 4.22–5.81)
RDW: 15.7 % — ABNORMAL HIGH (ref 11.5–15.5)
WBC Count: 7.4 10*3/uL (ref 4.0–10.5)
nRBC: 0 % (ref 0.0–0.2)

## 2023-11-27 LAB — IRON AND IRON BINDING CAPACITY (CC-WL,HP ONLY)
Iron: 89 ug/dL (ref 45–182)
Saturation Ratios: 28 % (ref 17.9–39.5)
TIBC: 321 ug/dL (ref 250–450)
UIBC: 232 ug/dL (ref 117–376)

## 2023-11-27 LAB — LACTATE DEHYDROGENASE: LDH: 170 U/L (ref 98–192)

## 2023-11-27 LAB — SAVE SMEAR(SSMR), FOR PROVIDER SLIDE REVIEW

## 2023-11-27 LAB — FERRITIN: Ferritin: 28 ng/mL (ref 24–336)

## 2023-11-27 NOTE — Progress Notes (Signed)
 Hematology and Oncology Follow Up Visit  Patrick Blake [id number] Jun 24, 1971 53 y.o. 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  Surprisingly, he actually went to do a few weeks ago.  His platelet count has been going up.  He actually underwent a donor lymphocyte infusion.  His actual donor is from [place].  Very impressed that he was able to donate again.  Hopefully, the  DLI will help to get his platelet count down.  Today, the platelet count is 760K.  He feels okay.  He has had some bony pain.  He has had no skin rash.  He has had no cough or shortness of breath.  There has been no change in bowel bladder habits.  He has had no diarrhea.  He has had no bleeding.  There is been no fever.  Overall, I would say that his performance status is probably ECOG 1.   Medications:  Current Outpatient Medications:    busPIRone (BUSPAR) 10 MG tablet, Take 20 mg by mouth 2 (two) times daily., Disp: , Rfl:    DULoxetine (CYMBALTA) 30 MG capsule, Take 30 mg by mouth 2 (two) times daily., Disp: , Rfl:    ezetimibe (ZETIA) 10 MG tablet, Take 10 mg by mouth daily., Disp: , Rfl: 3   glucose blood test strip, USE AS DIRECTED 2-4 TIMES DAILY TO TEST BLOOD SUGARS, Disp: , Rfl:    indapamide (LOZOL) 2.5 MG tablet, Take 2.5 mg by mouth daily., Disp: , Rfl:    insulin aspart (NOVOLOG) 100 UNIT/ML injection, Inject into the skin. TID sliding scale, Disp: , Rfl:    Insulin Syringe-Needle U-100 (INSULIN SYRINGE .3CC/31GX5/16") 31G X 5/16" 0.3 ML MISC, , Disp: , Rfl:    ONE TOUCH ULTRA TEST test strip, USE AS DIRECTED. TESTING FREQUENCY: 2-4 TIMES DAILY., Disp: , Rfl: 11   pioglitazone (ACTOS) 30 MG tablet, Take 30 mg by mouth daily., Disp: , Rfl:  3   pramipexole (MIRAPEX) 1.5 MG tablet, Take 1.5 mg by mouth daily., Disp: , Rfl:    prazosin (MINIPRESS) 2 MG capsule, Take 2 mg by mouth 2 (two) times daily., Disp: , Rfl:    rosuvastatin (CRESTOR) 20 MG tablet, Take 20 mg by mouth daily., Disp: , Rfl: 3   tadalafil (CIALIS) 10 MG tablet, Take 0.5-1 tablets by mouth daily as needed., Disp: , Rfl:    TOUJEO SOLOSTAR 300 UNIT/ML SOPN, 70 Units daily., Disp: , Rfl: 12   traZODone (DESYREL) 50 MG tablet, Take by mouth at bedtime as needed., Disp: , Rfl:   Allergies:  Allergies  Allergen Reactions   Ace Inhibitors Other (See Comments)    UNKNOWN REACTION   Atorvastatin Other (See Comments)    UNKNOWN REACTION    Hydrochlorothiazide Rash        Lisinopril-Hydrochlorothiazide Rash    Past Medical History, Surgical history, Social history, and Family History were reviewed and updated.  Review of Systems: Review of Systems  Constitutional:  Positive for fatigue.  HENT:  Negative.    Eyes: Negative.   Respiratory: Negative.    Cardiovascular: Negative.   Gastrointestinal: Negative.   Endocrine: Negative.   Genitourinary: Negative.    Musculoskeletal: Negative.  Skin: Negative.   Neurological: Negative.   Hematological: Negative.   Psychiatric/Behavioral: Negative.      Physical Exam:  weight is 286 lb (129.7 kg). His oral temperature is 99.2 F (37.3 C). His blood pressure is 108/77 and his pulse is 100. His respiration is 18 and oxygen saturation is 94%.   Wt Readings from Last 3 Encounters:  11/27/23 286 lb (129.7 kg)  09/04/23 283 lb (128.4 kg)  07/10/23 283 lb 0.4 oz (128.4 kg)    Physical Exam Vitals reviewed.  HENT:     Head: Normocephalic and atraumatic.  Eyes:     Pupils: Pupils are equal, round, and reactive to light.  Cardiovascular:     Rate and Rhythm: Normal rate and regular rhythm.     Heart sounds: Normal heart sounds.  Pulmonary:     Effort: Pulmonary effort is normal.     Breath sounds: Normal  breath sounds.  Abdominal:     General: Bowel sounds are normal.     Palpations: Abdomen is soft.     Comments: Abdominal exam is soft.  Bowel sounds are present.  There is no fluid wave.  There is no palpable liver or spleen tip.  Musculoskeletal:        General: No tenderness or deformity. Normal range of motion.     Cervical back: Normal range of motion.  Lymphadenopathy:     Cervical: No cervical adenopathy.  Skin:    General: Skin is warm and dry.     Findings: No erythema or rash.     Comments: On the back of his right tricep, he has a surgical scar that is vertical.  This is healed.  Neurological:     Mental Status: He is alert and oriented to person, place, and time.  Psychiatric:        Behavior: Behavior normal.        Thought Content: Thought content normal.        Judgment: Judgment normal.      Lab Results  Component Value Date   WBC 7.4 11/27/2023   HGB 16.3 11/27/2023   HCT 49.1 11/27/2023   MCV 95.0 11/27/2023   PLT 759 (H) 11/27/2023     Chemistry      Component Value Date/Time   NA 137 11/27/2023 1004   NA 143 08/11/2017 1332   NA 140 03/31/2017 1005   K 3.8 11/27/2023 1004   K 3.9 08/11/2017 1332   K 4.3 03/31/2017 1005   CL 101 11/27/2023 1004   CL 104 08/11/2017 1332   CO2 31 11/27/2023 1004   CO2 27 08/11/2017 1332   CO2 25 03/31/2017 1005   BUN 14 11/27/2023 1004   BUN 14 08/11/2017 1332   BUN 17.4 03/31/2017 1005   CREATININE 1.23 11/27/2023 1004   CREATININE 1.3 (H) 08/11/2017 1332   CREATININE 1.1 03/31/2017 1005      Component Value Date/Time   CALCIUM 9.9 11/27/2023 1004   CALCIUM 9.4 08/11/2017 1332   CALCIUM 9.5 03/31/2017 1005   ALKPHOS 57 11/27/2023 1004   ALKPHOS 92 (H) 08/11/2017 1332   ALKPHOS 100 03/31/2017 1005   AST 14 (L) 11/27/2023 1004   AST 17 03/31/2017 1005   ALT 13 11/27/2023 1004   ALT 25 08/11/2017 1332   ALT <6 03/31/2017 1005   BILITOT 0.5 11/27/2023 1004   BILITOT 0.54 03/31/2017 1005     On his  peripheral blood smear, he has some mild anisocytosis and poikilocytosis.  There are no nucleated red blood cells.  I see no teardrop cells.  He has no rouleaux formation.  White blood cells appear mature.  He has no hypersegmented polys.  I see no blast.  He has no immature lymphoid cells.  Platelets are increased in number.  He has several large platelets.  Platelets are well granulated.  Impression and Plan:  Patrick Blake is a very nice 53 year old white male.  We had seen him probably about 7 or 8 years ago.  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  He ultimately underwent an allogenic transplant.  He now has undergone a DLI.  Hopefully, this will "kick in."  We will have him come back in 3 weeks to recheck his blood counts.  Hopefully, he will be able to get additional infusions if felt necessary.   Josph Macho, MD 2/28/202511:13 AM

## 2023-12-21 ENCOUNTER — Ambulatory Visit: Payer: Medicare Other | Admitting: Hematology & Oncology

## 2023-12-21 ENCOUNTER — Inpatient Hospital Stay: Payer: Medicare Other

## 2023-12-25 ENCOUNTER — Inpatient Hospital Stay: Attending: Hematology & Oncology

## 2023-12-25 ENCOUNTER — Other Ambulatory Visit: Payer: Self-pay

## 2023-12-25 ENCOUNTER — Inpatient Hospital Stay: Admitting: Hematology & Oncology

## 2023-12-25 ENCOUNTER — Encounter: Payer: Self-pay | Admitting: Hematology & Oncology

## 2023-12-25 VITALS — HR 107 | Temp 99.5°F | Resp 18 | Ht 73.0 in | Wt 288.0 lb

## 2023-12-25 DIAGNOSIS — Z7984 Long term (current) use of oral hypoglycemic drugs: Secondary | ICD-10-CM | POA: Diagnosis not present

## 2023-12-25 DIAGNOSIS — C946 Myelodysplastic disease, not classified: Secondary | ICD-10-CM

## 2023-12-25 DIAGNOSIS — D469 Myelodysplastic syndrome, unspecified: Secondary | ICD-10-CM | POA: Diagnosis present

## 2023-12-25 DIAGNOSIS — Z79899 Other long term (current) drug therapy: Secondary | ICD-10-CM | POA: Diagnosis not present

## 2023-12-25 DIAGNOSIS — D75839 Thrombocytosis, unspecified: Secondary | ICD-10-CM | POA: Diagnosis not present

## 2023-12-25 LAB — CBC WITH DIFFERENTIAL (CANCER CENTER ONLY)
Abs Immature Granulocytes: 0.15 10*3/uL — ABNORMAL HIGH (ref 0.00–0.07)
Basophils Absolute: 0 10*3/uL (ref 0.0–0.1)
Basophils Relative: 0 %
Eosinophils Absolute: 0.2 10*3/uL (ref 0.0–0.5)
Eosinophils Relative: 2 %
HCT: 50.6 % (ref 39.0–52.0)
Hemoglobin: 16.8 g/dL (ref 13.0–17.0)
Immature Granulocytes: 2 %
Lymphocytes Relative: 27 %
Lymphs Abs: 2.5 10*3/uL (ref 0.7–4.0)
MCH: 31.8 pg (ref 26.0–34.0)
MCHC: 33.2 g/dL (ref 30.0–36.0)
MCV: 95.8 fL (ref 80.0–100.0)
Monocytes Absolute: 2.2 10*3/uL — ABNORMAL HIGH (ref 0.1–1.0)
Monocytes Relative: 24 %
Neutro Abs: 4.2 10*3/uL (ref 1.7–7.7)
Neutrophils Relative %: 45 %
Platelet Count: 775 10*3/uL — ABNORMAL HIGH (ref 150–400)
RBC: 5.28 MIL/uL (ref 4.22–5.81)
RDW: 15.9 % — ABNORMAL HIGH (ref 11.5–15.5)
WBC Count: 9.3 10*3/uL (ref 4.0–10.5)
nRBC: 0.2 % (ref 0.0–0.2)

## 2023-12-25 LAB — CMP (CANCER CENTER ONLY)
ALT: 15 U/L (ref 0–44)
AST: 14 U/L — ABNORMAL LOW (ref 15–41)
Albumin: 4.3 g/dL (ref 3.5–5.0)
Alkaline Phosphatase: 61 U/L (ref 38–126)
Anion gap: 8 (ref 5–15)
BUN: 16 mg/dL (ref 6–20)
CO2: 28 mmol/L (ref 22–32)
Calcium: 9.1 mg/dL (ref 8.9–10.3)
Chloride: 103 mmol/L (ref 98–111)
Creatinine: 1.18 mg/dL (ref 0.61–1.24)
GFR, Estimated: >60 mL/min (ref >60)
Glucose, Bld: 147 mg/dL — ABNORMAL HIGH (ref 70–99)
Potassium: 4 mmol/L (ref 3.5–5.1)
Sodium: 139 mmol/L (ref 135–145)
Total Bilirubin: 0.4 mg/dL (ref 0.0–1.2)
Total Protein: 6.5 g/dL (ref 6.5–8.1)

## 2023-12-25 LAB — FERRITIN: Ferritin: 37 ng/mL (ref 24–336)

## 2023-12-25 LAB — SAVE SMEAR(SSMR), FOR PROVIDER SLIDE REVIEW

## 2023-12-25 MED ORDER — ANAGRELIDE HCL 1 MG PO CAPS: 1.0000 mg | ORAL_CAPSULE | Freq: Two times a day (BID) | ORAL | 6 refills | Status: DC

## 2023-12-25 NOTE — Progress Notes (Signed)
 Hematology and Oncology Follow Up Visit  Patrick Blake [id number] [birth year] 53 y.o. 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 twice daily-start on 12/25/2023     Interim History:  Patrick Blake is back for follow-up.  Unfortunately, his platelet count is still trending upward.  He had the donor lymphocyte infusion I think back in late January.  I am not sure this really has taken any yet.  I think that we probably need to consider him for anagrelide right now.  I think that anagrelide 1 mg p.o. twice daily would not be a bad idea for him.  He has had no problem with nausea or vomiting.  He did have an episode where he had some bleeding from cold sores in his mouth.  This seemed to abate in a couple days.  He has had a good appetite.  He is trying to watch his blood sugars.  He has had no leg swelling.  He has had no rashes.  He has had no fever.  He may have had a couple sweats.  Currently, I would have to say that his performance status is probably ECOG 1.    Medications:  Current Outpatient Medications:    busPIRone (BUSPAR) 10 MG tablet, Take 20 mg by mouth 2 (two) times daily., Disp: , Rfl:    DULoxetine (CYMBALTA) 30 MG capsule, Take 30 mg by mouth 2 (two) times daily., Disp: , Rfl:    ezetimibe (ZETIA) 10 MG tablet, Take 10 mg by mouth daily., Disp: , Rfl: 3   glucose blood test strip, USE AS DIRECTED 2-4 TIMES DAILY TO TEST BLOOD SUGARS, Disp: , Rfl:    indapamide (LOZOL) 2.5 MG tablet, Take 2.5 mg by mouth daily., Disp: , Rfl:    insulin aspart (NOVOLOG) 100 UNIT/ML injection, Inject into the skin. TID sliding scale, Disp: , Rfl:    Insulin Syringe-Needle U-100 (INSULIN SYRINGE .3CC/31GX5/16") 31G X 5/16" 0.3 ML MISC, , Disp: , Rfl:    ONE TOUCH  ULTRA TEST test strip, USE AS DIRECTED. TESTING FREQUENCY: 2-4 TIMES DAILY., Disp: , Rfl: 11   pioglitazone (ACTOS) 30 MG tablet, Take 30 mg by mouth daily., Disp: , Rfl: 3   pramipexole (MIRAPEX) 1.5 MG tablet, Take 1.5 mg by mouth daily., Disp: , Rfl:    prazosin (MINIPRESS) 2 MG capsule, Take 2 mg by mouth 2 (two) times daily., Disp: , Rfl:    rosuvastatin (CRESTOR) 20 MG tablet, Take 20 mg by mouth daily., Disp: , Rfl: 3   tadalafil (CIALIS) 10 MG tablet, Take 0.5-1 tablets by mouth daily as needed., Disp: , Rfl:    TOUJEO SOLOSTAR 300 UNIT/ML SOPN, 70 Units daily., Disp: , Rfl: 12   traZODone (DESYREL) 50 MG tablet, Take by mouth at bedtime as needed., Disp: , Rfl:   Allergies:  Allergies  Allergen Reactions   Ace Inhibitors Other (See Comments)    UNKNOWN REACTION   Atorvastatin Other (See Comments)    UNKNOWN REACTION    Hydrochlorothiazide Rash        Lisinopril-Hydrochlorothiazide Rash    Past Medical History, Surgical history, Social history, and Family History were reviewed and updated.  Review of Systems: Review of Systems  Constitutional:  Positive for fatigue.  HENT:  Negative.    Eyes: Negative.   Respiratory: Negative.    Cardiovascular: Negative.   Gastrointestinal: Negative.   Endocrine: Negative.   Genitourinary: Negative.    Musculoskeletal: Negative.   Skin: Negative.   Neurological: Negative.   Hematological: Negative.   Psychiatric/Behavioral: Negative.      Physical Exam:  Temperature is 99.5.  Pulse 107.  Blood pressure is 108/80.  Weight is 288 pounds.  Wt Readings from Last 3 Encounters:  11/27/23 286 lb (129.7 kg)  09/04/23 283 lb (128.4 kg)  07/10/23 283 lb 0.4 oz (128.4 kg)    Physical Exam Vitals reviewed.  HENT:     Head: Normocephalic and atraumatic.  Eyes:     Pupils: Pupils are equal, round, and reactive to light.  Cardiovascular:     Rate and Rhythm: Normal rate and regular rhythm.     Heart sounds: Normal heart sounds.   Pulmonary:     Effort: Pulmonary effort is normal.     Breath sounds: Normal breath sounds.  Abdominal:     General: Bowel sounds are normal.     Palpations: Abdomen is soft.     Comments: Abdominal exam is soft.  Bowel sounds are present.  There is no fluid wave.  There is no palpable liver or spleen tip.  Musculoskeletal:        General: No tenderness or deformity. Normal range of motion.     Cervical back: Normal range of motion.  Lymphadenopathy:     Cervical: No cervical adenopathy.  Skin:    General: Skin is warm and dry.     Findings: No erythema or rash.     Comments: On the back of his right tricep, he has a surgical scar that is vertical.  This is healed.  Neurological:     Mental Status: He is alert and oriented to person, place, and time.  Psychiatric:        Behavior: Behavior normal.        Thought Content: Thought content normal.        Judgment: Judgment normal.     Lab Results  Component Value Date   WBC 9.3 12/25/2023   HGB 16.8 12/25/2023   HCT 50.6 12/25/2023   MCV 95.8 12/25/2023   PLT 775 (H) 12/25/2023     Chemistry      Component Value Date/Time   NA 137 11/27/2023 1004   NA 143 08/11/2017 1332   NA 140 03/31/2017 1005   K 3.8 11/27/2023 1004   K 3.9 08/11/2017 1332   K 4.3 03/31/2017 1005   CL 101 11/27/2023 1004   CL 104 08/11/2017 1332   CO2 31 11/27/2023 1004   CO2 27 08/11/2017 1332   CO2 25 03/31/2017 1005   BUN 14 11/27/2023 1004   BUN 14 08/11/2017 1332   BUN 17.4 03/31/2017 1005   CREATININE 1.23 11/27/2023 1004   CREATININE 1.3 (H) 08/11/2017 1332   CREATININE 1.1 03/31/2017 1005      Component Value Date/Time   CALCIUM 9.9 11/27/2023 1004   CALCIUM 9.4 08/11/2017 1332   CALCIUM 9.5 03/31/2017 1005   ALKPHOS 57 11/27/2023 1004   ALKPHOS 92 (H) 08/11/2017 1332   ALKPHOS 100 03/31/2017 1005   AST 14 (L) 11/27/2023 1004   AST 17 03/31/2017 1005   ALT 13 11/27/2023 1004   ALT 25 08/11/2017 1332   ALT <6 03/31/2017 1005    BILITOT 0.5 11/27/2023 1004   BILITOT 0.54 03/31/2017 1005  On his peripheral blood smear, he has some mild anisocytosis and poikilocytosis.  There are no nucleated red blood cells.  I see no teardrop cells.  He has no rouleaux formation.  White blood cells appear mature.  He has no hypersegmented polys.  I see no blast.  He has no immature lymphoid cells.  Platelets are increased in number.  He has several large platelets.  Platelets are well granulated.  Impression and Plan:  Mr. Gatliff is a very nice 53 year old white male.  We had seen him probably about 7 or 8 years ago.  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  He ultimately underwent an allogenic transplant.  He now has undergone a DLI.  I am not sure how effective this really has been.  Again I do think we are going to have to get him on some anagrelide.  I want to try to be proactive.  I suspect that his platelet count will continue to trend upward.  He sees [facility] the middle of May.  I would like to see him back in about a month.  Hopefully, we will see his platelet count coming down a little bit.    Josph Macho, MD 3/28/20252:59 PM

## 2023-12-28 LAB — IRON AND IRON BINDING CAPACITY (CC-WL,HP ONLY)
Iron: 84 ug/dL (ref 45–182)
Saturation Ratios: 26 % (ref 17.9–39.5)
TIBC: 321 ug/dL (ref 250–450)
UIBC: 237 ug/dL (ref 117–376)

## 2024-01-22 ENCOUNTER — Inpatient Hospital Stay: Admitting: Hematology & Oncology

## 2024-01-22 ENCOUNTER — Encounter: Payer: Self-pay | Admitting: Hematology & Oncology

## 2024-01-22 ENCOUNTER — Inpatient Hospital Stay: Attending: Hematology & Oncology

## 2024-01-22 VITALS — BP 122/75 | HR 103 | Temp 98.0°F | Resp 20 | Ht 73.0 in | Wt 288.0 lb

## 2024-01-22 DIAGNOSIS — Z7984 Long term (current) use of oral hypoglycemic drugs: Secondary | ICD-10-CM | POA: Diagnosis not present

## 2024-01-22 DIAGNOSIS — Z794 Long term (current) use of insulin: Secondary | ICD-10-CM | POA: Diagnosis not present

## 2024-01-22 DIAGNOSIS — Z79899 Other long term (current) drug therapy: Secondary | ICD-10-CM | POA: Insufficient documentation

## 2024-01-22 DIAGNOSIS — C946 Myelodysplastic disease, not classified: Secondary | ICD-10-CM

## 2024-01-22 DIAGNOSIS — E119 Type 2 diabetes mellitus without complications: Secondary | ICD-10-CM | POA: Insufficient documentation

## 2024-01-22 DIAGNOSIS — R718 Other abnormality of red blood cells: Secondary | ICD-10-CM | POA: Insufficient documentation

## 2024-01-22 DIAGNOSIS — Z9484 Stem cells transplant status: Secondary | ICD-10-CM | POA: Insufficient documentation

## 2024-01-22 DIAGNOSIS — D469 Myelodysplastic syndrome, unspecified: Secondary | ICD-10-CM | POA: Insufficient documentation

## 2024-01-22 DIAGNOSIS — D75839 Thrombocytosis, unspecified: Secondary | ICD-10-CM

## 2024-01-22 LAB — LACTATE DEHYDROGENASE: LDH: 192 U/L (ref 98–192)

## 2024-01-22 LAB — IRON AND IRON BINDING CAPACITY (CC-WL,HP ONLY)
Iron: 62 ug/dL (ref 45–182)
Saturation Ratios: 24 % (ref 17.9–39.5)
TIBC: 256 ug/dL (ref 250–450)
UIBC: 194 ug/dL (ref 117–376)

## 2024-01-22 LAB — CBC WITH DIFFERENTIAL (CANCER CENTER ONLY)
Abs Immature Granulocytes: 0.14 10*3/uL — ABNORMAL HIGH (ref 0.00–0.07)
Basophils Absolute: 0 10*3/uL (ref 0.0–0.1)
Basophils Relative: 1 %
Eosinophils Absolute: 0.2 10*3/uL (ref 0.0–0.5)
Eosinophils Relative: 2 %
HCT: 48.2 % (ref 39.0–52.0)
Hemoglobin: 15.5 g/dL (ref 13.0–17.0)
Immature Granulocytes: 2 %
Lymphocytes Relative: 27 %
Lymphs Abs: 2.3 10*3/uL (ref 0.7–4.0)
MCH: 30.3 pg (ref 26.0–34.0)
MCHC: 32.2 g/dL (ref 30.0–36.0)
MCV: 94.3 fL (ref 80.0–100.0)
Monocytes Absolute: 1.5 10*3/uL — ABNORMAL HIGH (ref 0.1–1.0)
Monocytes Relative: 18 %
Neutro Abs: 4.2 10*3/uL (ref 1.7–7.7)
Neutrophils Relative %: 50 %
Platelet Count: 464 10*3/uL — ABNORMAL HIGH (ref 150–400)
RBC: 5.11 MIL/uL (ref 4.22–5.81)
RDW: 15.8 % — ABNORMAL HIGH (ref 11.5–15.5)
WBC Count: 8.4 10*3/uL (ref 4.0–10.5)
nRBC: 0 % (ref 0.0–0.2)

## 2024-01-22 LAB — CMP (CANCER CENTER ONLY)
ALT: 12 U/L (ref 0–44)
AST: 13 U/L — ABNORMAL LOW (ref 15–41)
Albumin: 4 g/dL (ref 3.5–5.0)
Alkaline Phosphatase: 70 U/L (ref 38–126)
Anion gap: 9 (ref 5–15)
BUN: 15 mg/dL (ref 6–20)
CO2: 29 mmol/L (ref 22–32)
Calcium: 9.2 mg/dL (ref 8.9–10.3)
Chloride: 101 mmol/L (ref 98–111)
Creatinine: 1.58 mg/dL — ABNORMAL HIGH (ref 0.61–1.24)
GFR, Estimated: 52 mL/min — ABNORMAL LOW (ref >60)
Glucose, Bld: 272 mg/dL — ABNORMAL HIGH (ref 70–99)
Potassium: 3.8 mmol/L (ref 3.5–5.1)
Sodium: 139 mmol/L (ref 135–145)
Total Bilirubin: 0.2 mg/dL (ref 0.0–1.2)
Total Protein: 6.4 g/dL — ABNORMAL LOW (ref 6.5–8.1)

## 2024-01-22 LAB — FERRITIN: Ferritin: 106 ng/mL (ref 24–336)

## 2024-01-22 LAB — SAVE SMEAR(SSMR), FOR PROVIDER SLIDE REVIEW

## 2024-01-22 NOTE — Progress Notes (Signed)
 Hematology and Oncology Follow Up Visit  Patrick Blake [id number] 07/21/1971 53 y.o. 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 twice daily-start on 12/25/2023     Interim History:  Patrick Blake is back for follow-up.  Thankfully, the anagrelide  seems to be working.  His platelet count has come down quite nicely.  Today, the platelet count was 464,000.  Again, the improvement might be also the donor lymphocyte infusions that he is getting.  He goes back to [facility] on I think May 13 for an evaluation.  He has had no problems with his diabetes.  As always, his diabetes does tend to go up and down.  He has had no issues with fever.  There has been no bleeding.  He has had no change in bowel or bladder habits.  His appetite has been quite good.  He is still trying to watch what he eats.  Overall, I would have to say that his performance status is probably ECOG 1.    Medications:  Current Outpatient Medications:    anagrelide  (AGRYLIN) 1 MG capsule, Take 1 capsule (1 mg total) by mouth 2 (two) times daily., Disp: 60 capsule, Rfl: 6   busPIRone (BUSPAR) 10 MG tablet, Take 20 mg by mouth 2 (two) times daily., Disp: , Rfl:    DULoxetine (CYMBALTA) 30 MG capsule, Take 30 mg by mouth 2 (two) times daily., Disp: , Rfl:    ezetimibe (ZETIA) 10 MG tablet, Take 10 mg by mouth daily., Disp: , Rfl: 3   indapamide (LOZOL) 2.5 MG tablet, Take 2.5 mg by mouth daily., Disp: , Rfl:    insulin  aspart (NOVOLOG) 100 UNIT/ML injection, Inject into the skin. TID sliding scale, Disp: , Rfl:    Insulin  Syringe-Needle U-100 (INSULIN  SYRINGE .3CC/31GX5/16") 31G X 5/16" 0.3 ML MISC, , Disp: , Rfl:    pioglitazone (ACTOS) 30 MG tablet, Take 30 mg by mouth daily., Disp: , Rfl: 3    pramipexole (MIRAPEX) 1.5 MG tablet, Take 1.5 mg by mouth daily., Disp: , Rfl:    prazosin (MINIPRESS) 2 MG capsule, Take 2 mg by mouth 2 (two) times daily., Disp: , Rfl:    rosuvastatin (CRESTOR) 20 MG tablet, Take 20 mg by mouth daily., Disp: , Rfl: 3   tadalafil (CIALIS) 10 MG tablet, Take 0.5-1 tablets by mouth daily as needed., Disp: , Rfl:    TOUJEO  SOLOSTAR 300 UNIT/ML SOPN, 70 Units daily., Disp: , Rfl: 12   traZODone (DESYREL) 50 MG tablet, Take by mouth at bedtime as needed., Disp: , Rfl:    glucose blood test strip, USE AS DIRECTED 2-4 TIMES DAILY TO TEST BLOOD SUGARS (Patient not taking: Reported on 01/22/2024), Disp: , Rfl:    ONE TOUCH ULTRA TEST test strip, USE AS DIRECTED. TESTING FREQUENCY: 2-4 TIMES DAILY. (Patient not taking: Reported on 01/22/2024), Disp: , Rfl: 11  Allergies:  Allergies  Allergen Reactions   Ace Inhibitors Other (See Comments)    UNKNOWN REACTION   Atorvastatin Other (See Comments)    UNKNOWN REACTION    Hydrochlorothiazide Rash        Lisinopril-Hydrochlorothiazide Rash    Past Medical History, Surgical history, Social history, and Family History were reviewed and updated.  Review of Systems: Review of  Systems  Constitutional:  Positive for fatigue.  HENT:  Negative.    Eyes: Negative.   Respiratory: Negative.    Cardiovascular: Negative.   Gastrointestinal: Negative.   Endocrine: Negative.   Genitourinary: Negative.    Musculoskeletal: Negative.   Skin: Negative.   Neurological: Negative.   Hematological: Negative.   Psychiatric/Behavioral: Negative.      Physical Exam:  Temperature is 98.  Pulse 103.  Blood pressure 122/75.  Weight is 288 pounds.    Wt Readings from Last 3 Encounters:  01/22/24 288 lb (130.6 kg)  12/25/23 288 lb (130.6 kg)  11/27/23 286 lb (129.7 kg)    Physical Exam Vitals reviewed.  HENT:     Head: Normocephalic and atraumatic.  Eyes:     Pupils: Pupils are equal, round, and reactive to light.   Cardiovascular:     Rate and Rhythm: Normal rate and regular rhythm.     Heart sounds: Normal heart sounds.  Pulmonary:     Effort: Pulmonary effort is normal.     Breath sounds: Normal breath sounds.  Abdominal:     General: Bowel sounds are normal.     Palpations: Abdomen is soft.     Comments: Abdominal exam is soft.  Bowel sounds are present.  There is no fluid wave.  There is no palpable liver or spleen tip.  Musculoskeletal:        General: No tenderness or deformity. Normal range of motion.     Cervical back: Normal range of motion.  Lymphadenopathy:     Cervical: No cervical adenopathy.  Skin:    General: Skin is warm and dry.     Findings: No erythema or rash.     Comments: On the back of his right tricep, he has a surgical scar that is vertical.  This is healed.  Neurological:     Mental Status: He is alert and oriented to person, place, and time.  Psychiatric:        Behavior: Behavior normal.        Thought Content: Thought content normal.        Judgment: Judgment normal.     Lab Results  Component Value Date   WBC 8.4 01/22/2024   HGB 15.5 01/22/2024   HCT 48.2 01/22/2024   MCV 94.3 01/22/2024   PLT 464 (H) 01/22/2024     Chemistry      Component Value Date/Time   NA 139 01/22/2024 1224   NA 143 08/11/2017 1332   NA 140 03/31/2017 1005   K 3.8 01/22/2024 1224   K 3.9 08/11/2017 1332   K 4.3 03/31/2017 1005   CL 101 01/22/2024 1224   CL 104 08/11/2017 1332   CO2 29 01/22/2024 1224   CO2 27 08/11/2017 1332   CO2 25 03/31/2017 1005   BUN 15 01/22/2024 1224   BUN 14 08/11/2017 1332   BUN 17.4 03/31/2017 1005   CREATININE 1.58 (H) 01/22/2024 1224   CREATININE 1.3 (H) 08/11/2017 1332   CREATININE 1.1 03/31/2017 1005      Component Value Date/Time   CALCIUM 9.2 01/22/2024 1224   CALCIUM 9.4 08/11/2017 1332   CALCIUM 9.5 03/31/2017 1005   ALKPHOS 70 01/22/2024 1224   ALKPHOS 92 (H) 08/11/2017 1332   ALKPHOS 100 03/31/2017 1005   AST 13 (L)  01/22/2024 1224   AST 17 03/31/2017 1005   ALT 12 01/22/2024 1224   ALT 25 08/11/2017 1332   ALT <6 03/31/2017 1005   BILITOT 0.2  01/22/2024 1224   BILITOT 0.54 03/31/2017 1005     On his peripheral blood smear, he has some mild anisocytosis and poikilocytosis.  There are no nucleated red blood cells.  I see no teardrop cells.  He has no rouleaux formation.  White blood cells appear mature.  He has no hypersegmented polys.  I see no blast.  He has no immature lymphoid cells.  Platelets are slightly increased in number.  He has several large platelets.  Platelets are well granulated.  Impression and Plan:  Patrick Blake is a very nice 53 year old white male.  We had seen him probably about 7 or 8 years ago.  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  He ultimately underwent an allogenic transplant.  He now has undergone a DLI.  He currently is on anagrelide .  Again, I would like to hope that everything is improving because of the anagrelide .  Again he sees [facility] in a couple weeks.  Will plan to see him back sometime in June.  Am happy that his platelets are improving.  I am happy that his quality of life is doing well.   Ivor Mars, MD 4/25/20251:13 PM

## 2024-03-18 ENCOUNTER — Other Ambulatory Visit: Payer: Self-pay

## 2024-03-18 ENCOUNTER — Encounter: Payer: Self-pay | Admitting: Hematology & Oncology

## 2024-03-18 ENCOUNTER — Inpatient Hospital Stay: Admitting: Hematology & Oncology

## 2024-03-18 ENCOUNTER — Inpatient Hospital Stay: Attending: Hematology & Oncology

## 2024-03-18 VITALS — BP 121/64 | HR 115 | Temp 99.4°F | Resp 17 | Ht 73.0 in | Wt 284.0 lb

## 2024-03-18 DIAGNOSIS — D75839 Thrombocytosis, unspecified: Secondary | ICD-10-CM

## 2024-03-18 DIAGNOSIS — Z79899 Other long term (current) drug therapy: Secondary | ICD-10-CM | POA: Diagnosis not present

## 2024-03-18 DIAGNOSIS — C946 Myelodysplastic disease, not classified: Secondary | ICD-10-CM

## 2024-03-18 DIAGNOSIS — D469 Myelodysplastic syndrome, unspecified: Secondary | ICD-10-CM | POA: Insufficient documentation

## 2024-03-18 LAB — CBC WITH DIFFERENTIAL (CANCER CENTER ONLY)
Abs Immature Granulocytes: 0.25 10*3/uL — ABNORMAL HIGH (ref 0.00–0.07)
Basophils Absolute: 0 10*3/uL (ref 0.0–0.1)
Basophils Relative: 0 %
Eosinophils Absolute: 0.2 10*3/uL (ref 0.0–0.5)
Eosinophils Relative: 2 %
HCT: 50.3 % (ref 39.0–52.0)
Hemoglobin: 16.6 g/dL (ref 13.0–17.0)
Immature Granulocytes: 2 %
Lymphocytes Relative: 29 %
Lymphs Abs: 3.1 10*3/uL (ref 0.7–4.0)
MCH: 31.1 pg (ref 26.0–34.0)
MCHC: 33 g/dL (ref 30.0–36.0)
MCV: 94.4 fL (ref 80.0–100.0)
Monocytes Absolute: 2.4 10*3/uL — ABNORMAL HIGH (ref 0.1–1.0)
Monocytes Relative: 22 %
Neutro Abs: 5 10*3/uL (ref 1.7–7.7)
Neutrophils Relative %: 45 %
Platelet Count: 467 10*3/uL — ABNORMAL HIGH (ref 150–400)
RBC: 5.33 MIL/uL (ref 4.22–5.81)
RDW: 16.4 % — ABNORMAL HIGH (ref 11.5–15.5)
WBC Count: 11 10*3/uL — ABNORMAL HIGH (ref 4.0–10.5)
nRBC: 0 % (ref 0.0–0.2)

## 2024-03-18 LAB — CMP (CANCER CENTER ONLY)
ALT: 12 U/L (ref 0–44)
AST: 14 U/L — ABNORMAL LOW (ref 15–41)
Albumin: 4.4 g/dL (ref 3.5–5.0)
Alkaline Phosphatase: 60 U/L (ref 38–126)
Anion gap: 8 (ref 5–15)
BUN: 13 mg/dL (ref 6–20)
CO2: 30 mmol/L (ref 22–32)
Calcium: 10.2 mg/dL (ref 8.9–10.3)
Chloride: 101 mmol/L (ref 98–111)
Creatinine: 1.1 mg/dL (ref 0.61–1.24)
GFR, Estimated: >60 mL/min (ref >60)
Glucose, Bld: 122 mg/dL — ABNORMAL HIGH (ref 70–99)
Potassium: 4.1 mmol/L (ref 3.5–5.1)
Sodium: 139 mmol/L (ref 135–145)
Total Bilirubin: 0.5 mg/dL (ref 0.0–1.2)
Total Protein: 7 g/dL (ref 6.5–8.1)

## 2024-03-18 LAB — SAVE SMEAR(SSMR), FOR PROVIDER SLIDE REVIEW

## 2024-03-18 NOTE — Progress Notes (Signed)
 Hematology and Oncology Follow Up Visit  Patrick Blake [id number] 1971/02/15 53 y.o. 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 twice daily-start on 12/25/2023     Interim History:  Patrick Blake is back for follow-up.  He has been followed at [facility] closely.  He seems to be managing okay.  Unfortunately, the daughter lymphocyte infusions really did not work.  He is on anagrelide  right now.  This seems to be controlling his platelet count.  He has had no problems with bleeding.  He has had no problems with cough or shortness of breath.  He has had no nausea or vomiting.  He has had no change in bowel or bladder habits.  There is been no rashes.  He has had no headache.  I am not sure what the neck step for him might be.  I do not know if he would be a candidate for another stem cell transplant.  Thankfully, the anagrelide  seems to be controlling everything pretty well.  Thankfully, the anagrelide  seems to be working.  His platelet count has come down quite nicely.  Today, the platelet count was 464,000.  I am glad that his blood sugars are doing pretty well.  He has had no problems with fever.  He has had no bleeding.  He has had no rashes.  He has had no leg swelling.  There has been no headache.  Overall, I would say his performance status is probably ECOG 1.   Medications:  Current Outpatient Medications:    anagrelide  (AGRYLIN) 1 MG capsule, Take 1 capsule (1 mg total) by mouth 2 (two) times daily., Disp: 60 capsule, Rfl: 6   busPIRone (BUSPAR) 10 MG tablet, Take 20 mg by mouth 2 (two) times daily., Disp: , Rfl:    DULoxetine (CYMBALTA) 30 MG capsule, Take 30 mg by mouth 2 (two) times daily., Disp: , Rfl:    ezetimibe (ZETIA) 10 MG tablet, Take 10 mg by  mouth daily., Disp: , Rfl: 3   glucose blood test strip, USE AS DIRECTED 2-4 TIMES DAILY TO TEST BLOOD SUGARS (Patient not taking: Reported on 01/22/2024), Disp: , Rfl:    indapamide (LOZOL) 2.5 MG tablet, Take 2.5 mg by mouth daily., Disp: , Rfl:    insulin  aspart (NOVOLOG) 100 UNIT/ML injection, Inject into the skin. TID sliding scale, Disp: , Rfl:    Insulin  Syringe-Needle U-100 (INSULIN  SYRINGE .3CC/31GX5/16) 31G X 5/16 0.3 ML MISC, , Disp: , Rfl:    ONE TOUCH ULTRA TEST test strip, USE AS DIRECTED. TESTING FREQUENCY: 2-4 TIMES DAILY. (Patient not taking: Reported on 01/22/2024), Disp: , Rfl: 11   pioglitazone (ACTOS) 30 MG tablet, Take 30 mg by mouth daily., Disp: , Rfl: 3   pramipexole (MIRAPEX) 1.5 MG tablet, Take 1.5 mg by mouth daily., Disp: , Rfl:    prazosin (MINIPRESS) 2 MG capsule, Take 2 mg by mouth 2 (two) times daily., Disp: , Rfl:    rosuvastatin (CRESTOR) 20 MG tablet, Take 20 mg by mouth daily., Disp: , Rfl: 3   tadalafil (CIALIS) 10 MG tablet, Take 0.5-1 tablets by mouth daily as needed., Disp: , Rfl:    TOUJEO  SOLOSTAR 300 UNIT/ML SOPN, 70 Units daily., Disp: , Rfl: 12   traZODone (DESYREL)  50 MG tablet, Take by mouth at bedtime as needed., Disp: , Rfl:   Allergies:  Allergies  Allergen Reactions   Ace Inhibitors Other (See Comments)    UNKNOWN REACTION   Atorvastatin Other (See Comments)    UNKNOWN REACTION    Hydrochlorothiazide Rash        Lisinopril-Hydrochlorothiazide Rash    Past Medical History, Surgical history, Social history, and Family History were reviewed and updated.  Review of Systems: Review of Systems  Constitutional:  Positive for fatigue.  HENT:  Negative.    Eyes: Negative.   Respiratory: Negative.    Cardiovascular: Negative.   Gastrointestinal: Negative.   Endocrine: Negative.   Genitourinary: Negative.    Musculoskeletal: Negative.   Skin: Negative.   Neurological: Negative.   Hematological: Negative.   Psychiatric/Behavioral:  Negative.      Physical Exam:  Vital signs show temperature of 99.4.  Pulse 115.  Blood pressure 120/64.  Weight is 284 pounds.   Wt Readings from Last 3 Encounters:  03/18/24 284 lb (128.8 kg)  01/22/24 288 lb (130.6 kg)  12/25/23 288 lb (130.6 kg)    Physical Exam Vitals reviewed.  HENT:     Head: Normocephalic and atraumatic.   Eyes:     Pupils: Pupils are equal, round, and reactive to light.    Cardiovascular:     Rate and Rhythm: Normal rate and regular rhythm.     Heart sounds: Normal heart sounds.  Pulmonary:     Effort: Pulmonary effort is normal.     Breath sounds: Normal breath sounds.  Abdominal:     General: Bowel sounds are normal.     Palpations: Abdomen is soft.     Comments: Abdominal exam is soft.  Bowel sounds are present.  There is no fluid wave.  There is no palpable liver or spleen tip.   Musculoskeletal:        General: No tenderness or deformity. Normal range of motion.     Cervical back: Normal range of motion.  Lymphadenopathy:     Cervical: No cervical adenopathy.   Skin:    General: Skin is warm and dry.     Findings: No erythema or rash.     Comments: On the back of his right tricep, he has a surgical scar that is vertical.  This is healed.   Neurological:     Mental Status: He is alert and oriented to person, place, and time.   Psychiatric:        Behavior: Behavior normal.        Thought Content: Thought content normal.        Judgment: Judgment normal.      Lab Results  Component Value Date   WBC 11.0 (H) 03/18/2024   HGB 16.6 03/18/2024   HCT 50.3 03/18/2024   MCV 94.4 03/18/2024   PLT 467 (H) 03/18/2024     Chemistry      Component Value Date/Time   NA 139 03/18/2024 1156   NA 143 08/11/2017 1332   NA 140 03/31/2017 1005   K 4.1 03/18/2024 1156   K 3.9 08/11/2017 1332   K 4.3 03/31/2017 1005   CL 101 03/18/2024 1156   CL 104 08/11/2017 1332   CO2 30 03/18/2024 1156   CO2 27 08/11/2017 1332   CO2 25 03/31/2017  1005   BUN 13 03/18/2024 1156   BUN 14 08/11/2017 1332   BUN 17.4 03/31/2017 1005   CREATININE 1.10 03/18/2024 1156  CREATININE 1.3 (H) 08/11/2017 1332   CREATININE 1.1 03/31/2017 1005      Component Value Date/Time   CALCIUM 10.2 03/18/2024 1156   CALCIUM 9.4 08/11/2017 1332   CALCIUM 9.5 03/31/2017 1005   ALKPHOS 60 03/18/2024 1156   ALKPHOS 92 (H) 08/11/2017 1332   ALKPHOS 100 03/31/2017 1005   AST 14 (L) 03/18/2024 1156   AST 17 03/31/2017 1005   ALT 12 03/18/2024 1156   ALT 25 08/11/2017 1332   ALT <6 03/31/2017 1005   BILITOT 0.5 03/18/2024 1156   BILITOT 0.54 03/31/2017 1005     On his peripheral blood smear, he has some mild anisocytosis and poikilocytosis.  There are no nucleated red blood cells.  I see no teardrop cells.  He has no rouleaux formation.  White blood cells appear mature.  He has no hypersegmented polys.  I see no blast.  He has no immature lymphoid cells.  Platelets are slightly increased in number.  He has several large platelets.  Platelets are well granulated.  Impression and Plan:  Patrick Blake is a very nice 53 year old white male.  We had seen him probably about 7 or 8 years ago.  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  He ultimately underwent an allogenic transplant.  Felt that he probably needed to have another transplant.  So far, everything is holding pretty steady on the anagrelide .  He understands that he will need to have a transplant.  I know that [facility] follows him very closely.  I am glad that when I looked at his blood smear, I really do not see any blasts.  This certainly would be an indicator for him to need a transplant.  We will plan to get him back to see us  in another 6 weeks.   Ivor Mars, MD 6/20/20251:02 PM

## 2024-06-03 ENCOUNTER — Encounter: Payer: Self-pay | Admitting: Hematology & Oncology

## 2024-06-03 ENCOUNTER — Inpatient Hospital Stay: Attending: Hematology & Oncology

## 2024-06-03 ENCOUNTER — Inpatient Hospital Stay (HOSPITAL_BASED_OUTPATIENT_CLINIC_OR_DEPARTMENT_OTHER): Admitting: Hematology & Oncology

## 2024-06-03 VITALS — BP 115/74 | HR 99 | Temp 98.2°F | Resp 18 | Ht 73.0 in | Wt 284.0 lb

## 2024-06-03 DIAGNOSIS — Z79899 Other long term (current) drug therapy: Secondary | ICD-10-CM | POA: Diagnosis not present

## 2024-06-03 DIAGNOSIS — Z7984 Long term (current) use of oral hypoglycemic drugs: Secondary | ICD-10-CM | POA: Diagnosis not present

## 2024-06-03 DIAGNOSIS — E1165 Type 2 diabetes mellitus with hyperglycemia: Secondary | ICD-10-CM | POA: Diagnosis not present

## 2024-06-03 DIAGNOSIS — C946 Myelodysplastic disease, not classified: Secondary | ICD-10-CM

## 2024-06-03 DIAGNOSIS — Z7982 Long term (current) use of aspirin: Secondary | ICD-10-CM | POA: Diagnosis not present

## 2024-06-03 DIAGNOSIS — Z794 Long term (current) use of insulin: Secondary | ICD-10-CM | POA: Insufficient documentation

## 2024-06-03 DIAGNOSIS — D469 Myelodysplastic syndrome, unspecified: Secondary | ICD-10-CM | POA: Diagnosis present

## 2024-06-03 DIAGNOSIS — D75839 Thrombocytosis, unspecified: Secondary | ICD-10-CM

## 2024-06-03 DIAGNOSIS — Z9484 Stem cells transplant status: Secondary | ICD-10-CM | POA: Insufficient documentation

## 2024-06-03 LAB — LACTATE DEHYDROGENASE: LDH: 240 U/L — ABNORMAL HIGH (ref 98–192)

## 2024-06-03 LAB — CBC WITH DIFFERENTIAL (CANCER CENTER ONLY)
Abs Immature Granulocytes: 0.06 K/uL (ref 0.00–0.07)
Basophils Absolute: 0 K/uL (ref 0.0–0.1)
Basophils Relative: 0 %
Eosinophils Absolute: 0.1 K/uL (ref 0.0–0.5)
Eosinophils Relative: 1 %
HCT: 48.8 % (ref 39.0–52.0)
Hemoglobin: 15.9 g/dL (ref 13.0–17.0)
Immature Granulocytes: 1 %
Lymphocytes Relative: 25 %
Lymphs Abs: 1.8 K/uL (ref 0.7–4.0)
MCH: 30.7 pg (ref 26.0–34.0)
MCHC: 32.6 g/dL (ref 30.0–36.0)
MCV: 94.2 fL (ref 80.0–100.0)
Monocytes Absolute: 1.7 K/uL — ABNORMAL HIGH (ref 0.1–1.0)
Monocytes Relative: 24 %
Neutro Abs: 3.4 K/uL (ref 1.7–7.7)
Neutrophils Relative %: 49 %
Platelet Count: 311 K/uL (ref 150–400)
RBC: 5.18 MIL/uL (ref 4.22–5.81)
RDW: 15.4 % (ref 11.5–15.5)
WBC Count: 7.1 K/uL (ref 4.0–10.5)
nRBC: 0 % (ref 0.0–0.2)

## 2024-06-03 LAB — CMP (CANCER CENTER ONLY)
ALT: 11 U/L (ref 0–44)
AST: 17 U/L (ref 15–41)
Albumin: 4.2 g/dL (ref 3.5–5.0)
Alkaline Phosphatase: 60 U/L (ref 38–126)
Anion gap: 11 (ref 5–15)
BUN: 16 mg/dL (ref 6–20)
CO2: 27 mmol/L (ref 22–32)
Calcium: 9.3 mg/dL (ref 8.9–10.3)
Chloride: 100 mmol/L (ref 98–111)
Creatinine: 1.13 mg/dL (ref 0.61–1.24)
GFR, Estimated: >60 mL/min (ref >60)
Glucose, Bld: 296 mg/dL — ABNORMAL HIGH (ref 70–99)
Potassium: 4 mmol/L (ref 3.5–5.1)
Sodium: 137 mmol/L (ref 135–145)
Total Bilirubin: 0.4 mg/dL (ref 0.0–1.2)
Total Protein: 6.8 g/dL (ref 6.5–8.1)

## 2024-06-03 LAB — IRON AND IRON BINDING CAPACITY (CC-WL,HP ONLY)
Iron: 132 ug/dL (ref 45–182)
Saturation Ratios: 43 % — ABNORMAL HIGH (ref 17.9–39.5)
TIBC: 308 ug/dL (ref 250–450)
UIBC: 176 ug/dL

## 2024-06-03 LAB — RETICULOCYTES
Immature Retic Fract: 21.2 % — ABNORMAL HIGH (ref 2.3–15.9)
RBC.: 5.13 MIL/uL (ref 4.22–5.81)
Retic Count, Absolute: 120.6 K/uL (ref 19.0–186.0)
Retic Ct Pct: 2.4 % (ref 0.4–3.1)

## 2024-06-03 LAB — SAVE SMEAR(SSMR), FOR PROVIDER SLIDE REVIEW

## 2024-06-03 LAB — FERRITIN: Ferritin: 88 ng/mL (ref 24–336)

## 2024-06-03 NOTE — Progress Notes (Signed)
 Hematology and Oncology Follow Up Visit  Patrick Blake [id number] 08/04/1971 53 y.o. 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 twice daily-start on 12/25/2023     Interim History:  Patrick Blake is back for follow-up.  He is doing okay.  We last saw him back in June.  Since then, he and his family have been pretty busy.  They go to the beach.  They did travel as much as possible.  He is has had no problems with the anagrelide .  He has had no issues with nausea or vomiting.  He has had no headache.  He has had no cough or shortness of breath.  He has had no bleeding.  He has had no rashes.  His diabetes certainly is under poor control.  Today, his blood sugar is about 300.  He has had no fever.  He has had no sweats.  Overall, I would say that his performance status is probably ECOG 1.   Medications:  Current Outpatient Medications:    anagrelide  (AGRYLIN) 1 MG capsule, Take 1 capsule (1 mg total) by mouth 2 (two) times daily., Disp: 60 capsule, Rfl: 6   busPIRone (BUSPAR) 10 MG tablet, Take 20 mg by mouth 2 (two) times daily., Disp: , Rfl:    DULoxetine (CYMBALTA) 30 MG capsule, Take 30 mg by mouth 2 (two) times daily., Disp: , Rfl:    ezetimibe (ZETIA) 10 MG tablet, Take 10 mg by mouth daily., Disp: , Rfl: 3   glucose blood test strip, USE AS DIRECTED 2-4 TIMES DAILY TO TEST BLOOD SUGARS, Disp: , Rfl:    indapamide (LOZOL) 2.5 MG tablet, Take 2.5 mg by mouth daily., Disp: , Rfl:    insulin  aspart (NOVOLOG) 100 UNIT/ML injection, Inject into the skin. TID sliding scale, Disp: , Rfl:    Insulin  Syringe-Needle U-100 (INSULIN  SYRINGE .3CC/31GX5/16) 31G X 5/16 0.3 ML MISC, , Disp: , Rfl:    ONE TOUCH ULTRA TEST test strip, USE AS DIRECTED. TESTING FREQUENCY:  2-4 TIMES DAILY., Disp: , Rfl: 11   pioglitazone (ACTOS) 30 MG tablet, Take 30 mg by mouth daily., Disp: , Rfl: 3   pramipexole (MIRAPEX) 1.5 MG tablet, Take 1.5 mg by mouth daily., Disp: , Rfl:    prazosin (MINIPRESS) 2 MG capsule, Take 2 mg by mouth 2 (two) times daily., Disp: , Rfl:    rosuvastatin (CRESTOR) 20 MG tablet, Take 20 mg by mouth daily., Disp: , Rfl: 3   tadalafil (CIALIS) 10 MG tablet, Take 0.5-1 tablets by mouth daily as needed., Disp: , Rfl:    TOUJEO  SOLOSTAR 300 UNIT/ML SOPN, 70 Units daily., Disp: , Rfl: 12   traZODone (DESYREL) 50 MG tablet, Take by mouth at bedtime as needed., Disp: , Rfl:   Allergies:  Allergies  Allergen Reactions   Ace Inhibitors Other (See Comments)    UNKNOWN REACTION   Atorvastatin Other (See Comments)    UNKNOWN REACTION    Hydrochlorothiazide Rash        Lisinopril-Hydrochlorothiazide Rash    Past Medical History, Surgical history, Social history, and Family History were reviewed and updated.  Review of Systems: Review of Systems  Constitutional:  Positive for fatigue.  HENT:  Negative.    Eyes: Negative.  Respiratory: Negative.    Cardiovascular: Negative.   Gastrointestinal: Negative.   Endocrine: Negative.   Genitourinary: Negative.    Musculoskeletal: Negative.   Skin: Negative.   Neurological: Negative.   Hematological: Negative.   Psychiatric/Behavioral: Negative.      Physical Exam:  Vital signs show temperature of 98.2.  Pulse 99.  Blood pressure 115/74.  Weight is 284 pounds.    Wt Readings from Last 3 Encounters:  06/03/24 284 lb (128.8 kg)  03/18/24 284 lb (128.8 kg)  01/22/24 288 lb (130.6 kg)    Physical Exam Vitals reviewed.  HENT:     Head: Normocephalic and atraumatic.  Eyes:     Pupils: Pupils are equal, round, and reactive to light.  Cardiovascular:     Rate and Rhythm: Normal rate and regular rhythm.     Heart sounds: Normal heart sounds.  Pulmonary:     Effort: Pulmonary effort is  normal.     Breath sounds: Normal breath sounds.  Abdominal:     General: Bowel sounds are normal.     Palpations: Abdomen is soft.     Comments: Abdominal exam is soft.  Bowel sounds are present.  There is no fluid wave.  There is no palpable liver or spleen tip.  Musculoskeletal:        General: No tenderness or deformity. Normal range of motion.     Cervical back: Normal range of motion.  Lymphadenopathy:     Cervical: No cervical adenopathy.  Skin:    General: Skin is warm and dry.     Findings: No erythema or rash.     Comments: On the back of his right tricep, he has a surgical scar that is vertical.  This is healed.  Neurological:     Mental Status: He is alert and oriented to person, place, and time.  Psychiatric:        Behavior: Behavior normal.        Thought Content: Thought content normal.        Judgment: Judgment normal.      Lab Results  Component Value Date   WBC 7.1 06/03/2024   HGB 15.9 06/03/2024   HCT 48.8 06/03/2024   MCV 94.2 06/03/2024   PLT 311 06/03/2024     Chemistry      Component Value Date/Time   NA 139 03/18/2024 1156   NA 143 08/11/2017 1332   NA 140 03/31/2017 1005   K 4.1 03/18/2024 1156   K 3.9 08/11/2017 1332   K 4.3 03/31/2017 1005   CL 101 03/18/2024 1156   CL 104 08/11/2017 1332   CO2 30 03/18/2024 1156   CO2 27 08/11/2017 1332   CO2 25 03/31/2017 1005   BUN 13 03/18/2024 1156   BUN 14 08/11/2017 1332   BUN 17.4 03/31/2017 1005   CREATININE 1.10 03/18/2024 1156   CREATININE 1.3 (H) 08/11/2017 1332   CREATININE 1.1 03/31/2017 1005      Component Value Date/Time   CALCIUM 10.2 03/18/2024 1156   CALCIUM 9.4 08/11/2017 1332   CALCIUM 9.5 03/31/2017 1005   ALKPHOS 60 03/18/2024 1156   ALKPHOS 92 (H) 08/11/2017 1332   ALKPHOS 100 03/31/2017 1005   AST 14 (L) 03/18/2024 1156   AST 17 03/31/2017 1005   ALT 12 03/18/2024 1156   ALT 25 08/11/2017 1332   ALT <6 03/31/2017 1005   BILITOT 0.5 03/18/2024 1156   BILITOT 0.54  03/31/2017 1005     On his peripheral blood  smear, he has some mild anisocytosis and poikilocytosis.  There are no nucleated red blood cells.  I see no teardrop cells.  He has no rouleaux formation.  White blood cells appear mature.  He has no hypersegmented polys.  I see no blast.  He has no immature lymphoid cells.  Platelets are slightly increased in number.  He has several large platelets.  Platelets are well granulated.  Impression and Plan:  Mr. Mcguire is a very nice 53 year old white male.  We had seen him probably about 7 or 8 years ago.  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  He ultimately underwent an allogenic transplant.  I am very impressed with his platelet count today.  His CBC looks so much better now.  Hopefully, his bone marrow is not as active.  We will plan to get him back in November.  I want to try to get him back before the Holidays so that we can then get him through the holiday season.  I would not make any changes with the anagrelide .   Maude JONELLE Crease, MD 9/5/202510:39 AM

## 2024-06-22 ENCOUNTER — Other Ambulatory Visit: Payer: Self-pay | Admitting: Hematology & Oncology

## 2024-08-01 ENCOUNTER — Telehealth: Payer: Self-pay | Admitting: Hematology & Oncology

## 2024-08-01 ENCOUNTER — Telehealth: Payer: Self-pay | Admitting: *Deleted

## 2024-08-01 NOTE — Care Plan (Signed)
  Problem: Discharge Planning Goal: Knowledge of medical problems (What is my main problem?) Outcome: Adequate for Discharge Goal: Knowledge of self care (What do I need to do when I go home?) Outcome: Adequate for Discharge Goal: Knowledge of treatment plan (Why is it important for me to do this?) Outcome: Adequate for Discharge Goal: Knowledge of medication management Outcome: Adequate for Discharge   Problem: Injury Risk, Abnormal Glucose Level Goal: Glucose level within specified parameters Outcome: Adequate for Discharge   Problem: Deep Venous Thrombosis, Risk of Goal: Absence of deep venous thrombosis Outcome: Adequate for Discharge   Problem: Sensory Perception - Impaired Goal: Absence of physical injury Outcome: Adequate for Discharge   Problem: Bleeding, Risk of Goal: Absence of active bleeding Outcome: Adequate for Discharge Goal: Absence of impaired coagulation signs and symptoms Outcome: Adequate for Discharge

## 2024-08-01 NOTE — Telephone Encounter (Signed)
 Received notification that patient may be discharged from [facility] and needed a follow up appt with his oncologist.  Dr Timmy notified.  Wants to see patient a week earlier than current appt.  Scheduling message sent.

## 2024-08-01 NOTE — Telephone Encounter (Signed)
 Called to move up appt post-hospitalization per inbasket. LVM to return call for scheduling.

## 2024-08-08 ENCOUNTER — Inpatient Hospital Stay: Admitting: Hematology & Oncology

## 2024-08-08 ENCOUNTER — Inpatient Hospital Stay

## 2024-08-15 ENCOUNTER — Inpatient Hospital Stay: Attending: Hematology & Oncology

## 2024-08-15 ENCOUNTER — Encounter: Payer: Self-pay | Admitting: Hematology & Oncology

## 2024-08-15 ENCOUNTER — Inpatient Hospital Stay: Admitting: Hematology & Oncology

## 2024-08-15 VITALS — BP 108/87 | HR 118 | Temp 98.2°F | Resp 20 | Wt 273.1 lb

## 2024-08-15 DIAGNOSIS — C946 Myelodysplastic disease, not classified: Secondary | ICD-10-CM

## 2024-08-15 LAB — CMP (CANCER CENTER ONLY)
ALT: 16 U/L (ref 0–44)
AST: 14 U/L — ABNORMAL LOW (ref 15–41)
Albumin: 4.1 g/dL (ref 3.5–5.0)
Alkaline Phosphatase: 61 U/L (ref 38–126)
Anion gap: 13 (ref 5–15)
BUN: 20 mg/dL (ref 6–20)
CO2: 24 mmol/L (ref 22–32)
Calcium: 9.5 mg/dL (ref 8.9–10.3)
Chloride: 102 mmol/L (ref 98–111)
Creatinine: 1.02 mg/dL (ref 0.61–1.24)
GFR, Estimated: >60 mL/min (ref >60)
Glucose, Bld: 236 mg/dL — ABNORMAL HIGH (ref 70–99)
Potassium: 3.7 mmol/L (ref 3.5–5.1)
Sodium: 138 mmol/L (ref 135–145)
Total Bilirubin: 0.3 mg/dL (ref 0.0–1.2)
Total Protein: 7.1 g/dL (ref 6.5–8.1)

## 2024-08-15 LAB — CBC WITH DIFFERENTIAL (CANCER CENTER ONLY)
Abs Immature Granulocytes: 0.08 K/uL — ABNORMAL HIGH (ref 0.00–0.07)
Basophils Absolute: 0 K/uL (ref 0.0–0.1)
Basophils Relative: 1 %
Eosinophils Absolute: 0 K/uL (ref 0.0–0.5)
Eosinophils Relative: 1 %
HCT: 45.4 % (ref 39.0–52.0)
Hemoglobin: 14.9 g/dL (ref 13.0–17.0)
Immature Granulocytes: 1 %
Lymphocytes Relative: 26 %
Lymphs Abs: 2.3 K/uL (ref 0.7–4.0)
MCH: 30.3 pg (ref 26.0–34.0)
MCHC: 32.8 g/dL (ref 30.0–36.0)
MCV: 92.5 fL (ref 80.0–100.0)
Monocytes Absolute: 1.7 K/uL — ABNORMAL HIGH (ref 0.1–1.0)
Monocytes Relative: 20 %
Neutro Abs: 4.6 K/uL (ref 1.7–7.7)
Neutrophils Relative %: 51 %
Platelet Count: 312 K/uL (ref 150–400)
RBC: 4.91 MIL/uL (ref 4.22–5.81)
RDW: 16.9 % — ABNORMAL HIGH (ref 11.5–15.5)
WBC Count: 8.7 K/uL (ref 4.0–10.5)
nRBC: 0 % (ref 0.0–0.2)

## 2024-08-15 LAB — IRON AND IRON BINDING CAPACITY (CC-WL,HP ONLY)
Iron: 104 ug/dL (ref 45–182)
Saturation Ratios: 33 % (ref 17.9–39.5)
TIBC: 318 ug/dL (ref 250–450)
UIBC: 214 ug/dL

## 2024-08-15 LAB — LACTATE DEHYDROGENASE: LDH: 197 U/L (ref 105–235)

## 2024-08-15 LAB — FERRITIN: Ferritin: 174 ng/mL (ref 24–336)

## 2024-08-15 LAB — SAVE SMEAR(SSMR), FOR PROVIDER SLIDE REVIEW

## 2024-08-15 NOTE — Progress Notes (Signed)
 Hematology and Oncology Follow Up Visit  Patrick Blake [id number] [birth year] 53 y.o. 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 twice daily-start on 12/25/2023     Interim History:  Mr. Patrick Blake is back for follow-up.  Presently, he was admitted to [facility] in early November.  Incidentally he had some kind of SIRS.  He may have had a sinus infection.  He was given antibiotics.  He says white cell count was 25,000 and his platelet count was 700,000.  He was told that he had to come to see us  quickly after he was discharged.  Again he was discharged after a couple of days.  He looks quite good.  He feels okay.  Blood sugars, not surprising, have been on the high side.  He has had no issues with fever.  He has had no diarrhea.  There is been no rashes.  Baby does do his insulin .  He is trying to get his blood sugars under better control.  At the present time, I would say that his performance status is probably ECOG 0.     Medications:  Current Outpatient Medications:    anagrelide  (AGRYLIN) 1 MG capsule, TAKE 1 CAPSULE BY MOUTH 2 TIMES DAILY., Disp: 180 capsule, Rfl: 3   benzonatate (TESSALON) 100 MG capsule, Take 200 mg by mouth every 8 (eight) hours as needed., Disp: , Rfl:    busPIRone (BUSPAR) 10 MG tablet, Take 20 mg by mouth 2 (two) times daily. (Patient taking differently: Take 20 mg by mouth daily.), Disp: , Rfl:    DULoxetine (CYMBALTA) 30 MG capsule, Take 30 mg by mouth 2 (two) times daily. (Patient taking differently: Take 30 mg by mouth daily.), Disp: , Rfl:    ezetimibe (ZETIA) 10 MG tablet, Take 10 mg by mouth daily., Disp: , Rfl: 3   glucose blood test strip, USE AS DIRECTED 2-4 TIMES DAILY TO TEST BLOOD SUGARS, Disp: , Rfl:    indapamide (LOZOL)  2.5 MG tablet, Take 2.5 mg by mouth daily., Disp: , Rfl:    insulin  aspart (NOVOLOG) 100 UNIT/ML injection, Inject into the skin. TID sliding scale, Disp: , Rfl:    Insulin  Syringe-Needle U-100 (INSULIN  SYRINGE .3CC/31GX5/16) 31G X 5/16 0.3 ML MISC, , Disp: , Rfl:    ONE TOUCH ULTRA TEST test strip, USE AS DIRECTED. TESTING FREQUENCY: 2-4 TIMES DAILY., Disp: , Rfl: 11   rosuvastatin (CRESTOR) 20 MG tablet, Take 20 mg by mouth daily., Disp: , Rfl: 3   TOUJEO  SOLOSTAR 300 UNIT/ML SOPN, 70 Units daily. (Patient taking differently: 80 Units daily.), Disp: , Rfl: 12   traZODone (DESYREL) 50 MG tablet, Take by mouth at bedtime as needed., Disp: , Rfl:    pioglitazone (ACTOS) 30 MG tablet, Take 30 mg by mouth daily., Disp: , Rfl: 3   pramipexole (MIRAPEX) 1.5 MG tablet, Take 1.5 mg by mouth daily. (Patient not taking: Reported on 08/15/2024), Disp: , Rfl:    prazosin (MINIPRESS) 2 MG capsule, Take 2 mg by mouth 2 (two) times daily., Disp: , Rfl:    tadalafil (CIALIS) 10 MG tablet, Take 0.5-1 tablets by mouth daily as needed. (Patient not taking: Reported on 08/15/2024), Disp: , Rfl:   Allergies:  Allergies  Allergen Reactions  Ace Inhibitors Other (See Comments)    UNKNOWN REACTION   Atorvastatin Other (See Comments)    UNKNOWN REACTION    Hydrochlorothiazide Rash        Lisinopril-Hydrochlorothiazide Rash    Past Medical History, Surgical history, Social history, and Family History were reviewed and updated.  Review of Systems: Review of Systems  Constitutional:  Positive for fatigue.  HENT:  Negative.    Eyes: Negative.   Respiratory: Negative.    Cardiovascular: Negative.   Gastrointestinal: Negative.   Endocrine: Negative.   Genitourinary: Negative.    Musculoskeletal: Negative.   Skin: Negative.   Neurological: Negative.   Hematological: Negative.   Psychiatric/Behavioral: Negative.      Physical Exam:  Vital signs show temperature of 98.2.  Pulse 118.  Blood pressure  108/87.  Weight is 273 pounds.   Wt Readings from Last 3 Encounters:  08/15/24 273 lb 1.9 oz (123.9 kg)  06/03/24 284 lb (128.8 kg)  03/18/24 284 lb (128.8 kg)    Physical Exam Vitals reviewed.  HENT:     Head: Normocephalic and atraumatic.  Eyes:     Pupils: Pupils are equal, round, and reactive to light.  Cardiovascular:     Rate and Rhythm: Normal rate and regular rhythm.     Heart sounds: Normal heart sounds.  Pulmonary:     Effort: Pulmonary effort is normal.     Breath sounds: Normal breath sounds.  Abdominal:     General: Bowel sounds are normal.     Palpations: Abdomen is soft.     Comments: Abdominal exam is soft.  Bowel sounds are present.  There is no fluid wave.  There is no palpable liver or spleen tip.  Musculoskeletal:        General: No tenderness or deformity. Normal range of motion.     Cervical back: Normal range of motion.  Lymphadenopathy:     Cervical: No cervical adenopathy.  Skin:    General: Skin is warm and dry.     Findings: No erythema or rash.     Comments: On the back of his right tricep, he has a surgical scar that is vertical.  This is healed.  Neurological:     Mental Status: He is alert and oriented to person, place, and time.  Psychiatric:        Behavior: Behavior normal.        Thought Content: Thought content normal.        Judgment: Judgment normal.      Lab Results  Component Value Date   WBC 8.7 08/15/2024   HGB 14.9 08/15/2024   HCT 45.4 08/15/2024   MCV 92.5 08/15/2024   PLT 312 08/15/2024     Chemistry      Component Value Date/Time   NA 138 08/15/2024 1518   NA 143 08/11/2017 1332   NA 140 03/31/2017 1005   K 3.7 08/15/2024 1518   K 3.9 08/11/2017 1332   K 4.3 03/31/2017 1005   CL 102 08/15/2024 1518   CL 104 08/11/2017 1332   CO2 24 08/15/2024 1518   CO2 27 08/11/2017 1332   CO2 25 03/31/2017 1005   BUN 20 08/15/2024 1518   BUN 14 08/11/2017 1332   BUN 17.4 03/31/2017 1005   CREATININE 1.02 08/15/2024  1518   CREATININE 1.3 (H) 08/11/2017 1332   CREATININE 1.1 03/31/2017 1005      Component Value Date/Time   CALCIUM 9.5 08/15/2024 1518   CALCIUM 9.4 08/11/2017  1332   CALCIUM 9.5 03/31/2017 1005   ALKPHOS 61 08/15/2024 1518   ALKPHOS 92 (H) 08/11/2017 1332   ALKPHOS 100 03/31/2017 1005   AST 14 (L) 08/15/2024 1518   AST 17 03/31/2017 1005   ALT 16 08/15/2024 1518   ALT 25 08/11/2017 1332   ALT <6 03/31/2017 1005   BILITOT 0.3 08/15/2024 1518   BILITOT 0.54 03/31/2017 1005     On his peripheral blood smear, he has some mild anisocytosis and poikilocytosis.  There are no nucleated red blood cells.  I see no teardrop cells.  He has no rouleaux formation.  White blood cells appear mature.  He has a slight increase in monocytes.  He has no hypersegmented polys.  I see no blast.  He has no immature lymphoid cells.  Platelets are slightly increased in number.  He has several large platelets.  Platelets are well granulated.  Impression and Plan:  Mr. Pavlov is a very nice 53 year old white male.  We had seen him probably about 7 or 8 years ago.  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  He ultimately underwent an allogenic transplant.  Again, his blood counts are certainly normalized.  It would not surprise me if he had an exaggerated response when he had this infection.  His bone marrow is quite active.  Again, everything looks fine right now.  He looks good.  I forgot to mention that he did have a CT scan of the body when he was at [facility].  He does have a slight splenomegaly with his spleen size to be 13 cm.  I really do not think anything needs to be changed with him right now.  We will go ahead and plan to get him back to see me probably in about 8 weeks.  I want to make sure that we follow-up with him right after the holiday season.   Maude JONELLE Crease, MD 11/17/20254:53 PM

## 2024-08-19 ENCOUNTER — Inpatient Hospital Stay

## 2024-08-19 ENCOUNTER — Ambulatory Visit: Admitting: Hematology & Oncology

## 2024-10-07 ENCOUNTER — Encounter: Payer: Self-pay | Admitting: Hematology & Oncology

## 2024-10-07 ENCOUNTER — Other Ambulatory Visit: Payer: Self-pay

## 2024-10-07 ENCOUNTER — Inpatient Hospital Stay: Attending: Hematology & Oncology

## 2024-10-07 ENCOUNTER — Inpatient Hospital Stay: Admitting: Hematology & Oncology

## 2024-10-07 VITALS — BP 110/74 | HR 108 | Temp 98.8°F | Resp 18 | Ht 73.0 in | Wt 275.0 lb

## 2024-10-07 DIAGNOSIS — C946 Myelodysplastic disease, not classified: Secondary | ICD-10-CM

## 2024-10-07 LAB — CBC WITH DIFFERENTIAL (CANCER CENTER ONLY)
Abs Immature Granulocytes: 0.13 K/uL — ABNORMAL HIGH (ref 0.00–0.07)
Basophils Absolute: 0 K/uL (ref 0.0–0.1)
Basophils Relative: 0 %
Eosinophils Absolute: 0.1 K/uL (ref 0.0–0.5)
Eosinophils Relative: 1 %
HCT: 47.9 % (ref 39.0–52.0)
Hemoglobin: 15.5 g/dL (ref 13.0–17.0)
Immature Granulocytes: 1 %
Lymphocytes Relative: 27 %
Lymphs Abs: 2.8 K/uL (ref 0.7–4.0)
MCH: 29.9 pg (ref 26.0–34.0)
MCHC: 32.4 g/dL (ref 30.0–36.0)
MCV: 92.5 fL (ref 80.0–100.0)
Monocytes Absolute: 2.4 K/uL — ABNORMAL HIGH (ref 0.1–1.0)
Monocytes Relative: 23 %
Neutro Abs: 4.9 K/uL (ref 1.7–7.7)
Neutrophils Relative %: 48 %
Platelet Count: 332 K/uL (ref 150–400)
RBC: 5.18 MIL/uL (ref 4.22–5.81)
RDW: 17.2 % — ABNORMAL HIGH (ref 11.5–15.5)
WBC Count: 10.3 K/uL (ref 4.0–10.5)
nRBC: 0 % (ref 0.0–0.2)

## 2024-10-07 LAB — CMP (CANCER CENTER ONLY)
ALT: 12 U/L (ref 0–44)
AST: 14 U/L — ABNORMAL LOW (ref 15–41)
Albumin: 4.3 g/dL (ref 3.5–5.0)
Alkaline Phosphatase: 59 U/L (ref 38–126)
Anion gap: 11 (ref 5–15)
BUN: 15 mg/dL (ref 6–20)
CO2: 27 mmol/L (ref 22–32)
Calcium: 9.1 mg/dL (ref 8.9–10.3)
Chloride: 101 mmol/L (ref 98–111)
Creatinine: 1.06 mg/dL (ref 0.61–1.24)
GFR, Estimated: >60 mL/min
Glucose, Bld: 194 mg/dL — ABNORMAL HIGH (ref 70–99)
Potassium: 3.8 mmol/L (ref 3.5–5.1)
Sodium: 139 mmol/L (ref 135–145)
Total Bilirubin: 0.3 mg/dL (ref 0.0–1.2)
Total Protein: 6.8 g/dL (ref 6.5–8.1)

## 2024-10-07 LAB — SAVE SMEAR(SSMR), FOR PROVIDER SLIDE REVIEW

## 2024-10-07 LAB — LACTATE DEHYDROGENASE: LDH: 265 U/L — ABNORMAL HIGH (ref 105–235)

## 2024-10-07 NOTE — Progress Notes (Signed)
 " Hematology and Oncology Follow Up Visit  NAREK KNISS [id number] 1971/01/26 54 y.o. 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 twice daily-start on 12/25/2023     Interim History:  Mr. Albrecht is back for follow-up.  We last saw him back in November.  Over the holidays, he was sick.  It sounds like he may have had another viral syndrome.  This happened right around Christmas.  Thankfully, he was not hospitalized.  Right now, he feels quite well.  He really has had no complaints.  He has had no problems with rashes.  He has had no problems with nausea or vomiting.  He has had no cough.  He continues on anagrelide .  He is doing well on anagrelide .  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  He really has done well with this from my point of view.  He has had no bleeding.  When we last saw him, his ferritin was 174 with an iron saturation of 33%.  Overall, I would say that his performance status is probably ECOG 1.     Medications:  Current Outpatient Medications:    anagrelide  (AGRYLIN) 1 MG capsule, TAKE 1 CAPSULE BY MOUTH 2 TIMES DAILY., Disp: 180 capsule, Rfl: 3   benzonatate (TESSALON) 100 MG capsule, Take 200 mg by mouth every 8 (eight) hours as needed., Disp: , Rfl:    busPIRone (BUSPAR) 10 MG tablet, Take 20 mg by mouth 2 (two) times daily. (Patient taking differently: Take 20 mg by mouth daily.), Disp: , Rfl:    DULoxetine (CYMBALTA) 30 MG capsule, Take 30 mg by mouth 2 (two) times daily. (Patient taking differently: Take 30 mg by mouth daily.), Disp: , Rfl:    ezetimibe (ZETIA) 10 MG tablet, Take 10 mg by mouth daily., Disp: , Rfl: 3   glucose blood test strip, USE AS DIRECTED 2-4 TIMES DAILY TO TEST BLOOD SUGARS, Disp: ,  Rfl:    indapamide (LOZOL) 2.5 MG tablet, Take 2.5 mg by mouth daily., Disp: , Rfl:    insulin  aspart (NOVOLOG) 100 UNIT/ML injection, Inject into the skin. TID sliding scale, Disp: , Rfl:    Insulin  Syringe-Needle U-100 (INSULIN  SYRINGE .3CC/31GX5/16) 31G X 5/16 0.3 ML MISC, , Disp: , Rfl:    ONE TOUCH ULTRA TEST test strip, USE AS DIRECTED. TESTING FREQUENCY: 2-4 TIMES DAILY., Disp: , Rfl: 11   pioglitazone (ACTOS) 30 MG tablet, Take 30 mg by mouth daily., Disp: , Rfl: 3   pramipexole (MIRAPEX) 1.5 MG tablet, Take 1.5 mg by mouth daily. (Patient not taking: Reported on 08/15/2024), Disp: , Rfl:    prazosin (MINIPRESS) 2 MG capsule, Take 2 mg by mouth 2 (two) times daily., Disp: , Rfl:    rosuvastatin (CRESTOR) 20 MG tablet, Take 20 mg by mouth daily., Disp: , Rfl: 3   tadalafil (CIALIS) 10 MG tablet, Take 0.5-1 tablets by mouth daily as needed. (Patient not taking: Reported on 08/15/2024), Disp: , Rfl:    TOUJEO  SOLOSTAR 300 UNIT/ML SOPN, 70 Units daily. (Patient taking differently: 80 Units daily.), Disp: , Rfl: 12   traZODone (DESYREL) 50 MG tablet, Take by mouth at bedtime as needed., Disp: , Rfl:   Allergies:  Allergies  Allergen Reactions   Ace Inhibitors Other (See Comments)    UNKNOWN REACTION   Atorvastatin Other (See Comments)    UNKNOWN REACTION    Hydrochlorothiazide Rash        Lisinopril-Hydrochlorothiazide Rash    Past Medical History, Surgical history, Social history, and Family History were reviewed and updated.  Review of Systems: Review of Systems  Constitutional:  Positive for fatigue.  HENT:  Negative.    Eyes: Negative.   Respiratory: Negative.    Cardiovascular: Negative.   Gastrointestinal: Negative.   Endocrine: Negative.   Genitourinary: Negative.    Musculoskeletal: Negative.   Skin: Negative.   Neurological: Negative.   Hematological: Negative.   Psychiatric/Behavioral: Negative.      Physical Exam:  Vital signs show temperature of 98.8.   Pulse 108.  Blood pressure 110/74.  Weight is 275 pounds.    Wt Readings from Last 3 Encounters:  10/07/24 275 lb (124.7 kg)  08/15/24 273 lb 1.9 oz (123.9 kg)  06/03/24 284 lb (128.8 kg)    Physical Exam Vitals reviewed.  HENT:     Head: Normocephalic and atraumatic.  Eyes:     Pupils: Pupils are equal, round, and reactive to light.  Cardiovascular:     Rate and Rhythm: Normal rate and regular rhythm.     Heart sounds: Normal heart sounds.  Pulmonary:     Effort: Pulmonary effort is normal.     Breath sounds: Normal breath sounds.  Abdominal:     General: Bowel sounds are normal.     Palpations: Abdomen is soft.     Comments: Abdominal exam is soft.  Bowel sounds are present.  There is no fluid wave.  There is no palpable liver or spleen tip.  Musculoskeletal:        General: No tenderness or deformity. Normal range of motion.     Cervical back: Normal range of motion.  Lymphadenopathy:     Cervical: No cervical adenopathy.  Skin:    General: Skin is warm and dry.     Findings: No erythema or rash.     Comments: On the back of his right tricep, he has a surgical scar that is vertical.  This is healed.  Neurological:     Mental Status: He is alert and oriented to person, place, and time.  Psychiatric:        Behavior: Behavior normal.        Thought Content: Thought content normal.        Judgment: Judgment normal.      Lab Results  Component Value Date   WBC 10.3 10/07/2024   HGB 15.5 10/07/2024   HCT 47.9 10/07/2024   MCV 92.5 10/07/2024   PLT 332 10/07/2024     Chemistry      Component Value Date/Time   NA 139 10/07/2024 1523   NA 143 08/11/2017 1332   NA 140 03/31/2017 1005   K 3.8 10/07/2024 1523   K 3.9 08/11/2017 1332   K 4.3 03/31/2017 1005   CL 101 10/07/2024 1523   CL 104 08/11/2017 1332   CO2 27 10/07/2024 1523   CO2 27 08/11/2017 1332   CO2 25 03/31/2017 1005   BUN 15 10/07/2024 1523   BUN 14 08/11/2017 1332   BUN 17.4 03/31/2017 1005    CREATININE 1.06 10/07/2024 1523   CREATININE 1.3 (H) 08/11/2017 1332   CREATININE 1.1 03/31/2017 1005      Component Value Date/Time   CALCIUM 9.1 10/07/2024 1523  CALCIUM 9.4 08/11/2017 1332   CALCIUM 9.5 03/31/2017 1005   ALKPHOS 59 10/07/2024 1523   ALKPHOS 92 (H) 08/11/2017 1332   ALKPHOS 100 03/31/2017 1005   AST 14 (L) 10/07/2024 1523   AST 17 03/31/2017 1005   ALT 12 10/07/2024 1523   ALT 25 08/11/2017 1332   ALT <6 03/31/2017 1005   BILITOT 0.3 10/07/2024 1523   BILITOT 0.54 03/31/2017 1005       Impression and Plan:  Mr. Siordia is a very nice 54 year old white male.  We had seen him probably about 7 or 8 years ago.  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  He ultimately underwent an allogenic transplant.  Everything looks fantastic right now.  I am so happy that things are looking good for him.  He is doing well with his medications.  For right now, we will plan to get him back in another couple months.  I think he may have a telephone or virtual visit with [facility] sometime this month or so.  Again, I am just happy that things are going well for him right now.    Maude JONELLE Crease, MD 1/9/20264:54 PM "

## 2024-10-08 LAB — IGG, IGA, IGM
IgA: 223 mg/dL (ref 90–386)
IgG (Immunoglobin G), Serum: 933 mg/dL (ref 603–1613)
IgM (Immunoglobulin M), Srm: 57 mg/dL (ref 20–172)

## 2024-10-12 ENCOUNTER — Telehealth: Payer: Self-pay | Admitting: Dietician

## 2024-10-12 NOTE — Telephone Encounter (Signed)
 Patient screened on MST. First attempt to reach. Provided my cell# on voice mail to return call to set up a nutrition consult.  Micheline Craven, RDN, LDN Registered Dietitian, [facility] Part Time Remote (Usual office hours: Tuesday-Thursday) Cell: [phone]

## 2024-12-09 ENCOUNTER — Inpatient Hospital Stay: Admitting: Hematology & Oncology

## 2024-12-09 ENCOUNTER — Inpatient Hospital Stay
